# Patient Record
Sex: Male | Born: 1960 | Race: Black or African American | Hispanic: No | Marital: Single | Smoking: Never smoker
Health system: Southern US, Community
[De-identification: ages and names within clinical notes are randomized; demographics above are authoritative.]

## PROBLEM LIST (undated history)

## (undated) DIAGNOSIS — N186 End stage renal disease: Secondary | ICD-10-CM

## (undated) DIAGNOSIS — Z992 Dependence on renal dialysis: Secondary | ICD-10-CM

## (undated) DIAGNOSIS — I10 Essential (primary) hypertension: Secondary | ICD-10-CM

## (undated) HISTORY — DX: Essential (primary) hypertension: I10

## (undated) HISTORY — PX: AV FISTULA PLACEMENT: SHX1204

---

## 1998-05-11 ENCOUNTER — Emergency Department (HOSPITAL_COMMUNITY): Admission: EM | Admit: 1998-05-11 | Discharge: 1998-05-11 | Payer: Self-pay | Admitting: Emergency Medicine

## 1999-06-23 ENCOUNTER — Emergency Department (HOSPITAL_COMMUNITY): Admission: EM | Admit: 1999-06-23 | Discharge: 1999-06-23 | Payer: Self-pay | Admitting: Internal Medicine

## 2003-10-18 ENCOUNTER — Emergency Department (HOSPITAL_COMMUNITY): Admission: EM | Admit: 2003-10-18 | Discharge: 2003-10-18 | Payer: Self-pay | Admitting: Emergency Medicine

## 2004-02-19 ENCOUNTER — Emergency Department (HOSPITAL_COMMUNITY): Admission: EM | Admit: 2004-02-19 | Discharge: 2004-02-20 | Payer: Self-pay | Admitting: Emergency Medicine

## 2005-10-22 ENCOUNTER — Emergency Department (HOSPITAL_COMMUNITY): Admission: EM | Admit: 2005-10-22 | Discharge: 2005-10-22 | Payer: Self-pay | Admitting: Emergency Medicine

## 2008-09-07 HISTORY — PX: APPENDECTOMY: SHX54

## 2009-03-04 ENCOUNTER — Emergency Department (HOSPITAL_COMMUNITY): Admission: EM | Admit: 2009-03-04 | Discharge: 2009-03-04 | Payer: Self-pay | Admitting: Emergency Medicine

## 2009-05-13 ENCOUNTER — Observation Stay (HOSPITAL_COMMUNITY): Admission: EM | Admit: 2009-05-13 | Discharge: 2009-05-14 | Payer: Self-pay | Admitting: Emergency Medicine

## 2009-05-13 ENCOUNTER — Encounter (INDEPENDENT_AMBULATORY_CARE_PROVIDER_SITE_OTHER): Payer: Self-pay | Admitting: General Surgery

## 2010-12-12 LAB — URINALYSIS, ROUTINE W REFLEX MICROSCOPIC
Glucose, UA: NEGATIVE mg/dL
Specific Gravity, Urine: 1.01 (ref 1.005–1.030)
Urobilinogen, UA: 1 mg/dL (ref 0.0–1.0)

## 2010-12-12 LAB — DIFFERENTIAL
Basophils Absolute: 0.1 10*3/uL (ref 0.0–0.1)
Basophils Relative: 4 % — ABNORMAL HIGH (ref 0–1)
Eosinophils Absolute: 0.7 10*3/uL (ref 0.0–0.7)
Lymphocytes Relative: 26 % (ref 12–46)
Lymphocytes Relative: 28 % (ref 12–46)
Lymphs Abs: 1.2 10*3/uL (ref 0.7–4.0)
Monocytes Absolute: 0.6 10*3/uL (ref 0.1–1.0)
Monocytes Relative: 10 % (ref 3–12)
Neutro Abs: 2.2 10*3/uL (ref 1.7–7.7)
Neutrophils Relative %: 48 % (ref 43–77)
Neutrophils Relative %: 51 % (ref 43–77)

## 2010-12-12 LAB — COMPREHENSIVE METABOLIC PANEL
ALT: 16 U/L (ref 0–53)
Alkaline Phosphatase: 49 U/L (ref 39–117)
BUN: 12 mg/dL (ref 6–23)
CO2: 30 mEq/L (ref 19–32)
Calcium: 9.1 mg/dL (ref 8.4–10.5)
GFR calc Af Amer: 49 mL/min — ABNORMAL LOW (ref >60)
GFR calc non Af Amer: 41 mL/min — ABNORMAL LOW (ref >60)
Glucose, Bld: 110 mg/dL — ABNORMAL HIGH (ref 70–99)
Potassium: 3.4 mEq/L — ABNORMAL LOW (ref 3.5–5.1)
Sodium: 138 mEq/L (ref 135–145)

## 2010-12-12 LAB — URINE MICROSCOPIC-ADD ON

## 2010-12-12 LAB — LIPASE, BLOOD: Lipase: 24 U/L (ref 11–59)

## 2010-12-12 LAB — CBC
MCHC: 33.8 g/dL (ref 30.0–36.0)
MCV: 80.9 fL (ref 78.0–100.0)
Platelets: 249 10*3/uL (ref 150–400)
RBC: 5.06 MIL/uL (ref 4.22–5.81)
RDW: 14.7 % (ref 11.5–15.5)
WBC: 4.6 10*3/uL (ref 4.0–10.5)
WBC: 6.6 10*3/uL (ref 4.0–10.5)

## 2010-12-12 LAB — BASIC METABOLIC PANEL
Calcium: 8.7 mg/dL (ref 8.4–10.5)
Creatinine, Ser: 1.72 mg/dL — ABNORMAL HIGH (ref 0.4–1.5)
GFR calc Af Amer: 52 mL/min — ABNORMAL LOW (ref >60)
GFR calc non Af Amer: 43 mL/min — ABNORMAL LOW (ref >60)
Sodium: 136 mEq/L (ref 135–145)

## 2010-12-15 LAB — URINALYSIS, ROUTINE W REFLEX MICROSCOPIC
Nitrite: NEGATIVE
Specific Gravity, Urine: 1.013 (ref 1.005–1.030)
pH: 5.5 (ref 5.0–8.0)

## 2010-12-15 LAB — POCT I-STAT, CHEM 8
BUN: 20 mg/dL (ref 6–23)
Calcium, Ion: 1.15 mmol/L (ref 1.12–1.32)
Chloride: 103 mEq/L (ref 96–112)
Glucose, Bld: 101 mg/dL — ABNORMAL HIGH (ref 70–99)
HCT: 41 % (ref 39.0–52.0)

## 2010-12-15 LAB — CBC
HCT: 38.5 % — ABNORMAL LOW (ref 39.0–52.0)
Hemoglobin: 13 g/dL (ref 13.0–17.0)
MCHC: 33.8 g/dL (ref 30.0–36.0)
RBC: 4.73 MIL/uL (ref 4.22–5.81)

## 2010-12-15 LAB — DIFFERENTIAL
Basophils Relative: 1 % (ref 0–1)
Monocytes Absolute: 0.4 10*3/uL (ref 0.1–1.0)
Monocytes Relative: 9 % (ref 3–12)
Neutro Abs: 1.9 10*3/uL (ref 1.7–7.7)

## 2010-12-15 LAB — POCT CARDIAC MARKERS: Troponin i, poc: <0.05 ng/mL (ref 0.00–0.09)

## 2014-03-05 ENCOUNTER — Telehealth: Payer: Self-pay | Admitting: General Practice

## 2014-03-05 NOTE — Telephone Encounter (Signed)
Patient called to schedule his colonoscopy  Routing to [facility].

## 2014-03-06 NOTE — Telephone Encounter (Signed)
Routing to Julie.  

## 2014-03-06 NOTE — Telephone Encounter (Signed)
Noted. This is in DS box.

## 2014-03-08 NOTE — Telephone Encounter (Signed)
I called pt and he is at work and will call me back on Mon. He will need office visit first since he has been having blood pressure problems.   Please schedule OV and get paperwork from me if he calls on Mon and I am busy.   [facility] will [facility].

## 2014-03-12 NOTE — Telephone Encounter (Signed)
Agree 

## 2014-03-12 NOTE — Telephone Encounter (Signed)
Pt came by the office. He said he is still having problems with his blood pressure being elevated.  He has not told the [place] yet. I told him to see them and check it out. That if he doesn't have his BP under control we will not schedule the colonoscopy when he comes for the ov. He said that he would call them and check it out.   He is scheduled an OV with Neil Crouch, PA on 04/23/2014 at 10:30 AM.

## 2014-04-23 ENCOUNTER — Ambulatory Visit: Payer: Self-pay | Admitting: Gastroenterology

## 2014-05-24 ENCOUNTER — Encounter: Payer: Self-pay | Admitting: Gastroenterology

## 2014-05-24 ENCOUNTER — Ambulatory Visit: Payer: Self-pay | Admitting: Gastroenterology

## 2014-06-05 ENCOUNTER — Encounter: Payer: Self-pay | Admitting: Internal Medicine

## 2014-06-18 ENCOUNTER — Encounter: Payer: Self-pay | Admitting: Gastroenterology

## 2014-07-03 ENCOUNTER — Ambulatory Visit: Payer: Self-pay | Admitting: Gastroenterology

## 2014-07-09 ENCOUNTER — Ambulatory Visit: Payer: Self-pay | Admitting: Gastroenterology

## 2014-07-17 ENCOUNTER — Ambulatory Visit (INDEPENDENT_AMBULATORY_CARE_PROVIDER_SITE_OTHER): Payer: BC Managed Care – PPO | Admitting: Gastroenterology

## 2014-07-17 ENCOUNTER — Telehealth: Payer: Self-pay

## 2014-07-17 ENCOUNTER — Encounter: Payer: Self-pay | Admitting: Gastroenterology

## 2014-07-17 VITALS — BP 148/95 | HR 61 | Temp 97.1°F | Ht 71.0 in | Wt 166.0 lb

## 2014-07-17 DIAGNOSIS — Z1211 Encounter for screening for malignant neoplasm of colon: Secondary | ICD-10-CM | POA: Insufficient documentation

## 2014-07-17 DIAGNOSIS — I10 Essential (primary) hypertension: Secondary | ICD-10-CM

## 2014-07-17 NOTE — Assessment & Plan Note (Signed)
Colonoscopy in near future.  I have discussed the risks, alternatives, benefits with regards to but not limited to the risk of reaction to medication, bleeding, infection, perforation and the patient is agreeable to proceed. Written consent to be obtained.

## 2014-07-17 NOTE — Telephone Encounter (Signed)
I faxed a request for updated referral to the [place] at [facility] on this pt. I have him tentatively scheduled for TCS with Dr. Oneida Alar on 08/20/2014.

## 2014-07-17 NOTE — Patient Instructions (Signed)
1. Colonoscopy as scheduled. See separate instructions.  

## 2014-07-17 NOTE — Progress Notes (Signed)
Primary Care Physician:  J[facility]Peggy Anda Latina, NP  Primary Gastroenterologist:  Barney Drain, MD   Chief Complaint  Patient presents with  . Colonoscopy    HPI:  Justin Barrett is a 53 y.o. male here to schedule first ever screening colonoscopy. Referred from [facility]. Patient denies issues with constipation, diarrhea, melena, brbpr, abd pain, GERD, dysphagia, weight loss. Not aware of any FH of colon cancer or polyps. Has had some issues with his BP not being well-controlled which had previously delayed his colonoscopy but this has since been addressed.  Current Outpatient Prescriptions  Medication Sig Dispense Refill  . amLODipine (NORVASC) 10 MG tablet Take 10 mg by mouth daily.    . cloNIDine (CATAPRES) 0.1 MG tablet Take 0.1 mg by mouth 2 (two) times daily.     No current facility-administered medications for this visit.    Allergies as of 07/17/2014  . (No Known Allergies)    Past Medical History  Diagnosis Date  . HTN (hypertension)     Past Surgical History  Procedure Laterality Date  . Appendectomy  2010    Family History  Problem Relation Age of Onset  . Colon cancer Neg Hx     not known    History   Social History  . Marital Status: Single    Spouse Name: N/A    Number of Children: 0  . Years of Education: N/A   Occupational History  . [employer]    Social History Main Topics  . Smoking status: Never Smoker   . Smokeless tobacco: Not on file  . Alcohol Use: No  . Drug Use: No  . Sexual Activity: Not on file   Other Topics Concern  . Not on file   Social History Narrative  . No narrative on file      ROS:  General: Negative for anorexia, weight loss, fever, chills, fatigue, weakness. Eyes: Negative for vision changes.  ENT: Negative for hoarseness, difficulty swallowing , nasal congestion. CV: Negative for chest pain, angina, palpitations, dyspnea on exertion, peripheral edema.  Respiratory: Negative for dyspnea at  rest, dyspnea on exertion, cough, sputum, wheezing.  GI: See history of present illness. GU:  Negative for dysuria, hematuria, urinary incontinence, urinary frequency, nocturnal urination.  MS: Negative for joint pain, low back pain.  Derm: Negative for rash or itching.  Neuro: Negative for weakness, abnormal sensation, seizure, frequent headaches, memory loss, confusion.  Psych: Negative for anxiety, depression, suicidal ideation, hallucinations.  Endo: Negative for unusual weight change.  Heme: Negative for bruising or bleeding. Allergy: Negative for rash or hives.    Physical Examination:  BP 148/95 mmHg  Pulse 61  Temp(Src) 97.1 F (36.2 C) (Oral)  Ht 5\' 11"  (1.803 m)  Wt 166 lb (75.297 kg)  BMI 23.16 kg/m2   General: Well-nourished, well-developed in no acute distress.  Head: Normocephalic, atraumatic.   Eyes: Conjunctiva pink, no icterus. Mouth: Oropharyngeal mucosa moist and pink , no lesions erythema or exudate. Neck: Supple without thyromegaly, masses, or lymphadenopathy.  Lungs: Clear to auscultation bilaterally.  Heart: Regular rate and rhythm, no murmurs rubs or gallops.  Abdomen: Bowel sounds are normal, nontender, nondistended, no hepatosplenomegaly or masses, no abdominal bruits or    hernia , no rebound or guarding.   Rectal: not performed Extremities: No lower extremity edema. No clubbing or deformities.  Neuro: Alert and oriented x 4 , grossly normal neurologically.  Skin: Warm and dry, no rash or jaundice.   Psych: Alert  and cooperative, normal mood and affect.

## 2014-07-19 NOTE — Telephone Encounter (Signed)
Noted  

## 2014-07-19 NOTE — Telephone Encounter (Signed)
I received info from the [place] that the pt's authorization for  His colonoscopy expired on 03/27/2014. As directed by the chief of staff of the [facility], they will not extend the his authorization. They said to contact pt and yhave him contact his PCP at the [facility] to be scheduled for TCS. I called pt and told him, but he asked if his Secondary, [employer] would cover. I told him to give them a call and find out and let me know.  He said he will do so.

## 2014-07-24 NOTE — Telephone Encounter (Signed)
Noted  

## 2014-07-24 NOTE — Telephone Encounter (Signed)
I spoke to pt this morning and he said the [place] called him and he has an appt there on 07/27/2014 . They are planning to get him scheduled there for the colonoscopy.

## 2014-09-20 ENCOUNTER — Emergency Department (HOSPITAL_COMMUNITY)
Admission: EM | Admit: 2014-09-20 | Discharge: 2014-09-20 | Disposition: A | Discharge status: Home / Self Care | Payer: BLUE CROSS/BLUE SHIELD | Attending: Emergency Medicine | Admitting: Emergency Medicine

## 2014-09-20 ENCOUNTER — Encounter (HOSPITAL_COMMUNITY): Payer: Self-pay | Admitting: Emergency Medicine

## 2014-09-20 DIAGNOSIS — R109 Unspecified abdominal pain: Secondary | ICD-10-CM | POA: Diagnosis not present

## 2014-09-20 DIAGNOSIS — I10 Essential (primary) hypertension: Secondary | ICD-10-CM | POA: Diagnosis not present

## 2014-09-20 DIAGNOSIS — R110 Nausea: Secondary | ICD-10-CM | POA: Diagnosis not present

## 2014-09-20 DIAGNOSIS — Z79899 Other long term (current) drug therapy: Secondary | ICD-10-CM | POA: Insufficient documentation

## 2014-09-20 DIAGNOSIS — R519 Headache, unspecified: Secondary | ICD-10-CM

## 2014-09-20 DIAGNOSIS — R51 Headache: Secondary | ICD-10-CM | POA: Insufficient documentation

## 2014-09-20 LAB — COMPREHENSIVE METABOLIC PANEL WITH GFR
ALT: 11 U/L (ref 0–53)
AST: 12 U/L (ref 0–37)
Albumin: 3.6 g/dL (ref 3.5–5.2)
Alkaline Phosphatase: 76 U/L (ref 39–117)
Anion gap: 7 (ref 5–15)
BUN: 23 mg/dL (ref 6–23)
CO2: 26 mmol/L (ref 19–32)
Calcium: 9.1 mg/dL (ref 8.4–10.5)
Chloride: 105 meq/L (ref 96–112)
Creatinine, Ser: 4.15 mg/dL — ABNORMAL HIGH (ref 0.50–1.35)
GFR calc Af Amer: 18 mL/min — ABNORMAL LOW
GFR calc non Af Amer: 15 mL/min — ABNORMAL LOW
Glucose, Bld: 104 mg/dL — ABNORMAL HIGH (ref 70–99)
Potassium: 4.4 mmol/L (ref 3.5–5.1)
Sodium: 138 mmol/L (ref 135–145)
Total Bilirubin: 0.9 mg/dL (ref 0.3–1.2)
Total Protein: 7 g/dL (ref 6.0–8.3)

## 2014-09-20 LAB — CBC WITH DIFFERENTIAL/PLATELET
Basophils Absolute: 0 K/uL (ref 0.0–0.1)
Basophils Relative: 0 % (ref 0–1)
Eosinophils Absolute: 0.1 K/uL (ref 0.0–0.7)
Eosinophils Relative: 2 % (ref 0–5)
HCT: 39.4 % (ref 39.0–52.0)
Hemoglobin: 13.2 g/dL (ref 13.0–17.0)
Lymphocytes Relative: 22 % (ref 12–46)
Lymphs Abs: 1.6 K/uL (ref 0.7–4.0)
MCH: 26.3 pg (ref 26.0–34.0)
MCHC: 33.5 g/dL (ref 30.0–36.0)
MCV: 78.6 fL (ref 78.0–100.0)
Monocytes Absolute: 0.6 K/uL (ref 0.1–1.0)
Monocytes Relative: 9 % (ref 3–12)
Neutro Abs: 4.8 K/uL (ref 1.7–7.7)
Neutrophils Relative %: 67 % (ref 43–77)
Platelets: 309 K/uL (ref 150–400)
RBC: 5.01 MIL/uL (ref 4.22–5.81)
RDW: 14.1 % (ref 11.5–15.5)
WBC: 7.2 K/uL (ref 4.0–10.5)

## 2014-09-20 MED ORDER — CLONIDINE HCL 0.1 MG PO TABS
0.1000 mg | ORAL_TABLET | Freq: Once | ORAL | Status: AC
  Administered 2014-09-20: 0.1 mg via ORAL
  Filled 2014-09-20: qty 1

## 2014-09-20 MED ORDER — SODIUM CHLORIDE 0.9 % IV BOLUS (SEPSIS)
1000.0000 mL | Freq: Once | INTRAVENOUS | Status: AC
  Administered 2014-09-20: 1000 mL via INTRAVENOUS

## 2014-09-20 MED ORDER — IBUPROFEN 800 MG PO TABS: 800.0000 mg | ORAL_TABLET | Freq: Three times a day (TID) | ORAL | Status: DC

## 2014-09-20 MED ORDER — ONDANSETRON 4 MG PO TBDP: ORAL_TABLET | ORAL | Status: DC

## 2014-09-20 MED ORDER — KETOROLAC TROMETHAMINE 30 MG/ML IJ SOLN
30.0000 mg | Freq: Once | INTRAMUSCULAR | Status: AC
  Administered 2014-09-20: 30 mg via INTRAVENOUS
  Filled 2014-09-20: qty 1

## 2014-09-20 NOTE — ED Notes (Signed)
Having headache and  stomach ache for last two days.  Rate pain for both 8.  Have not taken any medications this morning including BP med.  Noted to have swelling to left lower leg and ankle.  Pt says this is a side effect of BP medication and does not bother him.

## 2014-09-20 NOTE — Discharge Instructions (Signed)
Drink plenty of fluids and follow up with your md if not improving.

## 2014-09-20 NOTE — ED Provider Notes (Signed)
CSN: [id number]     Arrival date & time 09/20/14  0705 History  This chart was scribed for Justin Diego, MD by Stephania Fragmin, ED Scribe. This patient was seen in room APA18/APA18 and the patient's care was started at 7:51 AM.    Chief Complaint  Patient presents with  . Headache   Patient is a 54 y.o. male presenting with headaches. The history is provided by the patient. No language interpreter was used.  Headache Pain location:  Generalized Radiates to:  Does not radiate Severity currently:  8/10 Duration:  2 days Timing:  Constant Chronicity:  New Relieved by:  None tried Worsened by:  Nothing tried Ineffective treatments:  None tried Associated symptoms: abdominal pain and nausea   Associated symptoms: no back pain, no congestion, no cough, no diarrhea, no fatigue, no fever, no seizures, no sinus pressure, no sore throat and no vomiting     HPI Comments: MANAS MCGUGAN is a 54 y.o. male who presents to the Emergency Department complaining of 8/10 headache and periumbilical abdominal pain that began 2 days ago. He also complains of associated nausea and chills. Patient states he takes clonidine and amlodipine daily, but he hasn't taken any this morning. He denies any sick contact. He also denies vomiting, fever, cough, sore throat, and diarrhea.  Past Medical History  Diagnosis Date  . HTN (hypertension)    Past Surgical History  Procedure Laterality Date  . Appendectomy  2010   Family History  Problem Relation Age of Onset  . Colon cancer Neg Hx     not known   History  Substance Use Topics  . Smoking status: Never Smoker   . Smokeless tobacco: Not on file  . Alcohol Use: No    Review of Systems  Constitutional: Positive for chills. Negative for fever, appetite change and fatigue.  HENT: Negative for congestion, ear discharge, sinus pressure and sore throat.   Eyes: Negative for discharge.  Respiratory: Negative for cough.   Cardiovascular: Negative for chest  pain.  Gastrointestinal: Positive for nausea and abdominal pain. Negative for vomiting and diarrhea.  Genitourinary: Negative for frequency and hematuria.  Musculoskeletal: Negative for back pain.  Skin: Negative for rash.  Neurological: Positive for headaches. Negative for seizures.  Psychiatric/Behavioral: Negative for hallucinations.      Allergies  Review of patient's allergies indicates no known allergies.  Home Medications   Prior to Admission medications   Medication Sig Start Date End Date Taking? Authorizing Provider  amLODipine (NORVASC) 10 MG tablet Take 10 mg by mouth daily.    Historical Provider, MD  cloNIDine (CATAPRES) 0.1 MG tablet Take 0.1 mg by mouth 2 (two) times daily.    Historical Provider, MD   BP 174/113 mmHg  Pulse 70  Temp(Src) 98.5 F (36.9 C) (Oral)  Resp 18  Ht 5\' 11"  (1.803 m)  Wt 160 lb (72.576 kg)  BMI 22.33 kg/m2  SpO2 99% Physical Exam  Constitutional: He is oriented to person, place, and time. He appears well-developed.  HENT:  Head: Normocephalic.  Dry mucus membranes.  Eyes: Conjunctivae and EOM are normal. No scleral icterus.  Neck: Neck supple. No thyromegaly present.  Cardiovascular: Normal rate and regular rhythm.  Exam reveals no gallop and no friction rub.   No murmur heard. Pulmonary/Chest: No stridor. He has no wheezes. He has no rales. He exhibits no tenderness.  Abdominal: He exhibits no distension. There is no tenderness. There is no rebound.  Musculoskeletal: Normal range of  motion. He exhibits no edema.  Lymphadenopathy:    He has no cervical adenopathy.  Neurological: He is oriented to person, place, and time. He exhibits normal muscle tone. Coordination normal.  Skin: No rash noted. No erythema.  Psychiatric: He has a normal mood and affect. His behavior is normal.  Nursing note and vitals reviewed.   ED Course  Procedures (including critical care time)  DIAGNOSTIC STUDIES: Oxygen Saturation is 100% on room  air, normal by my interpretation.    COORDINATION OF CARE: 7:53 AM - Discussed treatment plan with pt at bedside which includes IV fluids, pain medication (Toradol), clonidine, and blood tests, and pt agreed to plan.   Labs Review Labs Reviewed - No data to display  Imaging Review No results found.   EKG Interpretation None      MDM   Final diagnoses:  None     Headache,  Viral syndrome,   tx with motrin and zofran    Justin Diego, MD 09/20/14 [phone]

## 2014-09-20 NOTE — ED Notes (Addendum)
Pt reports headache and abdominal pain x2 days. Pt denies any n/v/d/fevers,dizziness,vision changes. nad noted.

## 2015-01-11 ENCOUNTER — Encounter (HOSPITAL_COMMUNITY): Payer: Self-pay | Admitting: *Deleted

## 2015-01-11 ENCOUNTER — Emergency Department (HOSPITAL_COMMUNITY): Payer: BLUE CROSS/BLUE SHIELD

## 2015-01-11 ENCOUNTER — Emergency Department (HOSPITAL_COMMUNITY)
Admission: EM | Admit: 2015-01-11 | Discharge: 2015-01-11 | Disposition: A | Discharge status: Home / Self Care | Payer: BLUE CROSS/BLUE SHIELD | Attending: Emergency Medicine | Admitting: Emergency Medicine

## 2015-01-11 DIAGNOSIS — Y998 Other external cause status: Secondary | ICD-10-CM | POA: Insufficient documentation

## 2015-01-11 DIAGNOSIS — S161XXA Strain of muscle, fascia and tendon at neck level, initial encounter: Secondary | ICD-10-CM | POA: Insufficient documentation

## 2015-01-11 DIAGNOSIS — Y92410 Unspecified street and highway as the place of occurrence of the external cause: Secondary | ICD-10-CM | POA: Insufficient documentation

## 2015-01-11 DIAGNOSIS — Y9389 Activity, other specified: Secondary | ICD-10-CM | POA: Diagnosis not present

## 2015-01-11 DIAGNOSIS — I10 Essential (primary) hypertension: Secondary | ICD-10-CM | POA: Insufficient documentation

## 2015-01-11 DIAGNOSIS — S29001A Unspecified injury of muscle and tendon of front wall of thorax, initial encounter: Secondary | ICD-10-CM | POA: Insufficient documentation

## 2015-01-11 DIAGNOSIS — R0789 Other chest pain: Secondary | ICD-10-CM

## 2015-01-11 DIAGNOSIS — S199XXA Unspecified injury of neck, initial encounter: Secondary | ICD-10-CM | POA: Diagnosis present

## 2015-01-11 DIAGNOSIS — Z79899 Other long term (current) drug therapy: Secondary | ICD-10-CM | POA: Insufficient documentation

## 2015-01-11 MED ORDER — NAPROXEN 500 MG PO TABS: 500.0000 mg | ORAL_TABLET | Freq: Two times a day (BID) | ORAL | Status: DC
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
## 2016-01-12 LAB — BASIC METABOLIC PANEL: Glucose: 72 mg/dL

## 2017-04-05 ENCOUNTER — Emergency Department (HOSPITAL_COMMUNITY)
Admission: EM | Admit: 2017-04-05 | Discharge: 2017-04-06 | Disposition: A | Discharge status: Home / Self Care | Payer: BLUE CROSS/BLUE SHIELD | Attending: Emergency Medicine | Admitting: Emergency Medicine

## 2017-04-05 ENCOUNTER — Emergency Department (HOSPITAL_COMMUNITY): Payer: BLUE CROSS/BLUE SHIELD

## 2017-04-05 ENCOUNTER — Encounter (HOSPITAL_COMMUNITY): Payer: Self-pay | Admitting: Emergency Medicine

## 2017-04-05 DIAGNOSIS — M25511 Pain in right shoulder: Secondary | ICD-10-CM | POA: Diagnosis not present

## 2017-04-05 DIAGNOSIS — M546 Pain in thoracic spine: Secondary | ICD-10-CM | POA: Insufficient documentation

## 2017-04-05 DIAGNOSIS — M549 Dorsalgia, unspecified: Secondary | ICD-10-CM | POA: Diagnosis present

## 2017-04-05 DIAGNOSIS — Z452 Encounter for adjustment and management of vascular access device: Secondary | ICD-10-CM | POA: Diagnosis not present

## 2017-04-05 DIAGNOSIS — I150 Renovascular hypertension: Secondary | ICD-10-CM

## 2017-04-05 DIAGNOSIS — I120 Hypertensive chronic kidney disease with stage 5 chronic kidney disease or end stage renal disease: Secondary | ICD-10-CM | POA: Insufficient documentation

## 2017-04-05 DIAGNOSIS — N186 End stage renal disease: Secondary | ICD-10-CM | POA: Diagnosis not present

## 2017-04-05 DIAGNOSIS — Z992 Dependence on renal dialysis: Secondary | ICD-10-CM | POA: Diagnosis not present

## 2017-04-05 DIAGNOSIS — Z79899 Other long term (current) drug therapy: Secondary | ICD-10-CM | POA: Diagnosis not present

## 2017-04-05 HISTORY — DX: Dependence on renal dialysis: N18.6

## 2017-04-05 HISTORY — DX: Dependence on renal dialysis: Z99.2

## 2017-04-05 LAB — I-STAT TROPONIN, ED: Troponin i, poc: 0.01 ng/mL (ref 0.00–0.08)

## 2017-04-05 LAB — I-STAT CHEM 8, ED
BUN: 27 mg/dL — ABNORMAL HIGH (ref 6–20)
CHLORIDE: 99 mmol/L — AB (ref 101–111)
Calcium, Ion: 1.13 mmol/L — ABNORMAL LOW (ref 1.15–1.40)
Creatinine, Ser: 5.4 mg/dL — ABNORMAL HIGH (ref 0.61–1.24)
Glucose, Bld: 97 mg/dL (ref 65–99)
HCT: 38 % — ABNORMAL LOW (ref 39.0–52.0)
Hemoglobin: 12.9 g/dL — ABNORMAL LOW (ref 13.0–17.0)
Potassium: 3.5 mmol/L (ref 3.5–5.1)
SODIUM: 138 mmol/L (ref 135–145)
TCO2: 27 mmol/L (ref 0–100)

## 2017-04-05 MED ORDER — CLONIDINE HCL 0.2 MG PO TABS
0.2000 mg | ORAL_TABLET | Freq: Once | ORAL | Status: AC
  Administered 2017-04-05: 0.2 mg via ORAL
  Filled 2017-04-05: qty 1

## 2017-04-05 MED ORDER — IOPAMIDOL (ISOVUE-370) INJECTION 76%
100.0000 mL | Freq: Once | INTRAVENOUS | Status: AC | PRN
  Administered 2017-04-05: 100 mL via INTRAVENOUS

## 2017-04-05 MED ORDER — HYDROMORPHONE HCL 1 MG/ML IJ SOLN
1.0000 mg | Freq: Once | INTRAMUSCULAR | Status: AC
  Administered 2017-04-05: 1 mg via INTRAVENOUS
  Filled 2017-04-05: qty 1

## 2017-04-05 MED ORDER — ONDANSETRON HCL 4 MG/2ML IJ SOLN
4.0000 mg | Freq: Once | INTRAMUSCULAR | Status: AC
  Administered 2017-04-05: 4 mg via INTRAVENOUS
  Filled 2017-04-05: qty 2

## 2017-04-05 NOTE — ED Notes (Signed)
Pt brought back form Ct

## 2017-04-05 NOTE — ED Provider Notes (Signed)
[facility] DEPT Provider Note   CSN: [id number] Arrival date & time: 04/05/17  1632     History   Chief Complaint Chief Complaint  Patient presents with  . Back Pain    HPI Justin Barrett is a 56 y.o. male.  Patient complains of pain in between his scapulas in the upper back for couple days. Does not seem to be worse with lifting   The history is provided by the patient.  Back Pain   This is a new problem. The current episode started 2 days ago. The problem occurs constantly. The problem has not changed since onset.The pain is associated with no known injury. The pain is present in the thoracic spine. The quality of the pain is described as stabbing. The pain does not radiate. The pain is at a severity of 4/10. The pain is moderate. Exacerbated by: Nothing. Pertinent negatives include no chest pain, no headaches and no abdominal pain.    Past Medical History:  Diagnosis Date  . End stage renal failure on dialysis ([place])   . HTN (hypertension)     Patient Active Problem List   Diagnosis Date Noted  . HTN (hypertension) 07/17/2014  . Encounter for screening colonoscopy 07/17/2014    Past Surgical History:  Procedure Laterality Date  . APPENDECTOMY  2010  . AV FISTULA PLACEMENT         Home Medications    Prior to Admission medications   Medication Sig Start Date End Date Taking? Authorizing Provider  amLODipine (NORVASC) 10 MG tablet Take 10 mg by mouth daily.    [provider]  cloNIDine (CATAPRES) 0.2 MG tablet Take 1 tablet by mouth daily.  01/04/15   [provider]  ibuprofen (ADVIL,MOTRIN) 800 MG tablet Take 1 tablet (800 mg total) by mouth 3 (three) times daily. Patient not taking: Reported on 01/11/2015 09/20/14   Milton Ferguson, MD  naproxen (NAPROSYN) 500 MG tablet Take 1 tablet (500 mg total) by mouth 2 (two) times daily. 01/11/15   Fredia Sorrow, MD  ondansetron (ZOFRAN ODT) 4 MG disintegrating tablet 4mg  ODT q4 hours prn  nausea/vomit Patient not taking: Reported on 01/11/2015 09/20/14   Milton Ferguson, MD    Family History Family History  Problem Relation Age of Onset  . Colon cancer Neg Hx        not known    Social History Social History  Substance Use Topics  . Smoking status: Never Smoker  . Smokeless tobacco: Never Used  . Alcohol use No     Allergies   Patient has no known allergies.   Review of Systems Review of Systems  Constitutional: Negative for appetite change and fatigue.  HENT: Negative for congestion, ear discharge and sinus pressure.   Eyes: Negative for discharge.  Respiratory: Negative for cough.   Cardiovascular: Negative for chest pain.  Gastrointestinal: Negative for abdominal pain and diarrhea.  Genitourinary: Negative for frequency and hematuria.  Musculoskeletal: Positive for back pain.  Skin: Negative for rash.  Neurological: Negative for seizures and headaches.  Psychiatric/Behavioral: Negative for hallucinations.     Physical Exam Updated Vital Signs BP (!) 148/104   Pulse (!) 49   Temp 97.8 F (36.6 C) (Oral)   Resp 16   Ht 5\' 11"  (1.803 m)   Wt 77.1 kg (170 lb)   SpO2 97%   BMI 23.71 kg/m   Physical Exam  Constitutional: He is oriented to person, place, and time. He appears well-developed.  HENT:  Head:  Normocephalic.  Eyes: Conjunctivae and EOM are normal. No scleral icterus.  Neck: Neck supple. No thyromegaly present.  Cardiovascular: Normal rate and regular rhythm.  Exam reveals no gallop and no friction rub.   No murmur heard. Pulmonary/Chest: No stridor. He has no wheezes. He has no rales. He exhibits no tenderness.  Abdominal: He exhibits no distension. There is no tenderness. There is no rebound.  Musculoskeletal: Normal range of motion. He exhibits no edema.  Lymphadenopathy:    He has no cervical adenopathy.  Neurological: He is oriented to person, place, and time. He exhibits normal muscle tone. Coordination normal.  Skin: No rash  noted. No erythema.  Psychiatric: He has a normal mood and affect. His behavior is normal.     ED Treatments / Results  Labs (all labs ordered are listed, but only abnormal results are displayed) Labs Reviewed  I-STAT CHEM 8, ED - Abnormal; Notable for the following:       Result Value   Chloride 99 (*)    BUN 27 (*)    Creatinine, Ser 5.40 (*)    Calcium, Ion 1.13 (*)    Hemoglobin 12.9 (*)    HCT 38.0 (*)    All other components within normal limits  I-STAT TROPONIN, ED    EKG  EKG Interpretation  Date/Time:  Monday April 05 2017 22:23:14 EDT Ventricular Rate:  53 PR Interval:    QRS Duration: 89 QT Interval:  452 QTC Calculation: 425 R Axis:   21 Text Interpretation:  Sinus rhythm Probable left atrial enlargement Abnormal R-wave progression, early transition ST elevation, consider inferior injury Confirmed by Milton Ferguson [id number]) on 04/05/2017 11:00:14 PM       Radiology Dg Chest 2 View  Result Date: 04/05/2017 CLINICAL DATA:  56 year old male with chest pain. EXAM: CHEST  2 VIEW COMPARISON:  Chest radiograph dated 01/11/2015 FINDINGS: Right IJ dialysis catheter with tip over the right atrium. The lungs are clear. There is no pleural effusion or pneumothorax. The cardiac silhouette is within normal limits. No acute osseous pathology. IMPRESSION: 1. No acute cardiopulmonary process. 2. Dialysis catheter with tip over the right atrium. Electronically Signed   By: Anner Crete M.D.   On: 04/05/2017 23:47    Procedures Procedures (including critical care time)  Medications Ordered in ED Medications  HYDROmorphone (DILAUDID) injection 1 mg (1 mg Intravenous Given 04/05/17 2304)  ondansetron (ZOFRAN) injection 4 mg (4 mg Intravenous Given 04/05/17 2303)  cloNIDine (CATAPRES) tablet 0.2 mg (0.2 mg Oral Given 04/05/17 2225)  iopamidol (ISOVUE-370) 76 % injection 100 mL (100 mLs Intravenous Contrast Given 04/05/17 2342)     Initial Impression / Assessment and Plan / ED  Course  I have reviewed the triage vital signs and the nursing notes.  Pertinent labs & imaging results that were available during my care of the patient were reviewed by me and considered in my medical decision making (see chart for details).     Thoracic back pain.    Final Clinical Impressions(s) / ED Diagnoses   Final diagnoses:  None    New Prescriptions New Prescriptions   No medications on file     Milton Ferguson, MD 04/10/17 1446

## 2017-04-05 NOTE — ED Triage Notes (Signed)
Patient complaining of upper back pain at right shoulder blade x 3 days. 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

## 2017-04-06 MED ORDER — HYDROCODONE-ACETAMINOPHEN 5-325 MG PO TABS: 1.0000 | ORAL_TABLET | Freq: Four times a day (QID) | ORAL | 0 refills | Status: DC | PRN

## 2017-04-06 NOTE — ED Provider Notes (Signed)
Patient signed out pending CTA of the chest. CT negative for dissection. There was some concern about patient's Vas-Cath location. This appears to be in the low SVC. Blood pressure improved after clonidine. Patient given a short course of Norco given his history of renal dysfunction for his back pain. Discussed results with the patient.  After history, exam, and medical workup I feel the patient has been appropriately medically screened and is safe for discharge home. Pertinent diagnoses were discussed with the patient. Patient was given return precautions.    Merryl Hacker, MD 04/06/17 [phone]

## 2017-08-29 ENCOUNTER — Emergency Department (HOSPITAL_COMMUNITY)
Admission: EM | Admit: 2017-08-29 | Discharge: 2017-08-29 | Disposition: A | Discharge status: Home / Self Care | Payer: BLUE CROSS/BLUE SHIELD | Attending: Emergency Medicine | Admitting: Emergency Medicine

## 2017-08-29 ENCOUNTER — Encounter (HOSPITAL_COMMUNITY): Payer: Self-pay

## 2017-08-29 ENCOUNTER — Emergency Department (HOSPITAL_COMMUNITY): Payer: BLUE CROSS/BLUE SHIELD

## 2017-08-29 DIAGNOSIS — N186 End stage renal disease: Secondary | ICD-10-CM | POA: Diagnosis not present

## 2017-08-29 DIAGNOSIS — I10 Essential (primary) hypertension: Secondary | ICD-10-CM | POA: Insufficient documentation

## 2017-08-29 DIAGNOSIS — Z452 Encounter for adjustment and management of vascular access device: Secondary | ICD-10-CM

## 2017-08-29 DIAGNOSIS — R509 Fever, unspecified: Secondary | ICD-10-CM | POA: Diagnosis present

## 2017-08-29 DIAGNOSIS — Z79899 Other long term (current) drug therapy: Secondary | ICD-10-CM | POA: Diagnosis not present

## 2017-08-29 LAB — CBC WITH DIFFERENTIAL/PLATELET
Basophils Absolute: 0 10*3/uL (ref 0.0–0.1)
Basophils Relative: 0 %
EOS ABS: 0.1 10*3/uL (ref 0.0–0.7)
EOS PCT: 1 %
HCT: 33.6 % — ABNORMAL LOW (ref 39.0–52.0)
HEMOGLOBIN: 11.1 g/dL — AB (ref 13.0–17.0)
LYMPHS PCT: 6 %
Lymphs Abs: 0.5 10*3/uL — ABNORMAL LOW (ref 0.7–4.0)
MCH: 27.1 pg (ref 26.0–34.0)
MCHC: 33 g/dL (ref 30.0–36.0)
MCV: 82.2 fL (ref 78.0–100.0)
MONOS PCT: 1 %
Monocytes Absolute: 0.1 10*3/uL (ref 0.1–1.0)
Neutro Abs: 7.9 10*3/uL — ABNORMAL HIGH (ref 1.7–7.7)
Neutrophils Relative %: 92 %
PLATELETS: 220 10*3/uL (ref 150–400)
RBC: 4.09 MIL/uL — ABNORMAL LOW (ref 4.22–5.81)
RDW: 13 % (ref 11.5–15.5)
WBC: 8.6 10*3/uL (ref 4.0–10.5)

## 2017-08-29 LAB — COMPREHENSIVE METABOLIC PANEL
ALBUMIN: 3.2 g/dL — AB (ref 3.5–5.0)
ALT: 11 U/L — AB (ref 17–63)
AST: 15 U/L (ref 15–41)
Alkaline Phosphatase: 73 U/L (ref 38–126)
Anion gap: 12 (ref 5–15)
BUN: 11 mg/dL (ref 6–20)
CO2: 27 mmol/L (ref 22–32)
Calcium: 8.9 mg/dL (ref 8.9–10.3)
Chloride: 99 mmol/L — ABNORMAL LOW (ref 101–111)
Creatinine, Ser: 3.06 mg/dL — ABNORMAL HIGH (ref 0.61–1.24)
GFR calc Af Amer: 25 mL/min — ABNORMAL LOW (ref >60)
GFR calc non Af Amer: 21 mL/min — ABNORMAL LOW (ref >60)
Glucose, Bld: 89 mg/dL (ref 65–99)
Potassium: 3 mmol/L — ABNORMAL LOW (ref 3.5–5.1)
Sodium: 138 mmol/L (ref 135–145)
Total Bilirubin: 0.9 mg/dL (ref 0.3–1.2)
Total Protein: 7 g/dL (ref 6.5–8.1)

## 2017-08-29 LAB — I-STAT CG4 LACTIC ACID, ED: Lactic Acid, Venous: 0.9 mmol/L (ref 0.5–1.9)

## 2017-08-29 NOTE — ED Triage Notes (Signed)
Pt is a dialysis pt and has had fever and body aches since last Monday.  Reports Pt had blood cultures drawn on 12/18 and has received iv antibiotics during dialysis on 12/17, 19, 21, and 23.    Pt alert and oriented.  Received ceftazidime and vancomycin today.

## 2017-08-29 NOTE — Consult Note (Signed)
Requested by ER and nephrology to remove permacath due to patient having fevers.  Informed consent obtained from patient.  Permacath removed at bedside without difficulty.  Tip sent for culture.  Pressure held.  No hematoma at the end of the procedure.  Patient tolerated procedure well.

## 2017-08-29 NOTE — ED Provider Notes (Addendum)
[facility] EMERGENCY DEPARTMENT Provider Note   CSN: [id number] Arrival date & time: 08/29/17  1200     History   Chief Complaint Chief Complaint  Patient presents with  . Fever    HPI Justin Barrett is a 56 y.o. male.  Patient with known end-stage renal disease on dialysis Monday, Wednesday, Friday presents with fever and body aches since last Monday.  Recent blood cultures were positive and patient has received intravenous antibiotics on 1217, 19, 21, 23.  He is still running a fever with chills.  He received ceftazidime and vancomycin today.  His local nephrologist Dr. Lowanda Foster states he needs to have his central catheter removed.  This catheter was placed in the [facility] system in February 2017.  Patient is alert and ambulatory.  No chest pain, dyspnea, neuro deficits      Past Medical History:  Diagnosis Date  . End stage renal failure on dialysis ([place])   . HTN (hypertension)     Patient Active Problem List   Diagnosis Date Noted  . HTN (hypertension) 07/17/2014  . Encounter for screening colonoscopy 07/17/2014    Past Surgical History:  Procedure Laterality Date  . APPENDECTOMY  2010  . AV FISTULA PLACEMENT         Home Medications    Prior to Admission medications   Medication Sig Start Date End Date Taking? Authorizing Provider  cefUROXime (CEFTIN) 500 MG tablet Take 500 mg by mouth daily. 04/10/17  Yes [provider]  amLODipine (NORVASC) 10 MG tablet Take 10 mg by mouth daily.    [provider]  cloNIDine (CATAPRES) 0.2 MG tablet Take 1 tablet by mouth daily.  01/04/15   [provider]  HYDROcodone-acetaminophen (NORCO/VICODIN) 5-325 MG tablet Take 1-2 tablets by mouth every 6 (six) hours as needed. 04/06/17   Horton, Barbette Hair, MD  lisinopril (PRINIVIL,ZESTRIL) 40 MG tablet Take 0.5 tablets by mouth daily. 08/02/17   [provider]  naproxen (NAPROSYN) 500 MG tablet Take 1 tablet (500 mg total) by mouth 2 (two)  times daily. 01/11/15   Fredia Sorrow, MD  SODIUM BICARBONATE PO Take 1 tablet by mouth daily.    [provider]    Family History Family History  Problem Relation Age of Onset  . Colon cancer Neg Hx        not known    Social History Social History   Tobacco Use  . Smoking status: Never Smoker  . Smokeless tobacco: Never Used  Substance Use Topics  . Alcohol use: No    Alcohol/week: 0.0 oz  . Drug use: No     Allergies   Patient has no known allergies.   Review of Systems Review of Systems  All other systems reviewed and are negative.    Physical Exam Updated Vital Signs BP (!) 147/90 (BP Location: Right Arm)   Pulse 83   Temp (!) 101 F (38.3 C) (Oral)   Resp 20   Ht 5\' 11"  (1.803 m)   Wt 79.4 kg (175 lb)   SpO2 94%   BMI 24.41 kg/m   Physical Exam  Constitutional: He is oriented to person, place, and time. He appears well-developed and well-nourished.  Nontoxic-appearing, febrile  HENT:  Head: Normocephalic and atraumatic.  Eyes: Conjunctivae are normal.  Neck: Neck supple.  Cardiovascular: Normal rate and regular rhythm.  Pulmonary/Chest: Effort normal and breath sounds normal.  Catheter right anterior chest wall  Abdominal: Soft. Bowel sounds are normal.  Musculoskeletal:  Normal range of motion.  Neurological: He is alert and oriented to person, place, and time.  Skin: Skin is warm and dry.  Psychiatric: He has a normal mood and affect. His behavior is normal.  Nursing note and vitals reviewed.    ED Treatments / Results  Labs (all labs ordered are listed, but only abnormal results are displayed) Labs Reviewed  COMPREHENSIVE METABOLIC PANEL - Abnormal; Notable for the following components:      Result Value   Potassium 3.0 (*)    Chloride 99 (*)    Creatinine, Ser 3.06 (*)    Albumin 3.2 (*)    ALT 11 (*)    GFR calc non Af Amer 21 (*)    GFR calc Af Amer 25 (*)    All other components within normal limits  CBC WITH  DIFFERENTIAL/PLATELET - Abnormal; Notable for the following components:   RBC 4.09 (*)    Hemoglobin 11.1 (*)    HCT 33.6 (*)    Neutro Abs 7.9 (*)    Lymphs Abs 0.5 (*)    All other components within normal limits  CULTURE, BLOOD (ROUTINE X 2)  CULTURE, BLOOD (ROUTINE X 2)  URINALYSIS, ROUTINE W REFLEX MICROSCOPIC  I-STAT CG4 LACTIC ACID, ED  I-STAT CG4 LACTIC ACID, ED    EKG  EKG Interpretation None       Radiology Dg Chest 2 View  Result Date: 08/29/2017 CLINICAL DATA:  One-week history of intermittent fever, chills, nausea and vomiting and myalgias. Current history of end-stage renal disease on hemodialysis. EXAM: CHEST  2 VIEW COMPARISON:  Chest x-rays 04/05/2017, 01/11/2015. CTA chest 04/05/2017. FINDINGS: AP erect and lateral images were obtained. Cardiac silhouette mildly enlarged, unchanged. Thoracic aorta tortuous, unchanged. Hilar and mediastinal contours otherwise unremarkable. Right jugular dialysis catheter tips at or just below the cavoatrial junction, unchanged. Small bilateral pleural effusions, best seen on the lateral image. Lungs clear. Bronchovascular markings normal. Mild pulmonary venous hypertension without overt edema. Visualized bony thorax intact. IMPRESSION: 1. Small bilateral pleural effusions. No acute cardiopulmonary disease otherwise. 2. Stable mild cardiomegaly without evidence of pulmonary edema. Electronically Signed   By: Evangeline Dakin M.D.   On: 08/29/2017 12:58    Procedures Procedures (including critical care time)  Medications Ordered in ED Medications - No data to display   Initial Impression / Assessment and Plan / ED Course  I have reviewed the triage vital signs and the nursing notes.  Pertinent labs & imaging results that were available during my care of the patient were reviewed by me and considered in my medical decision making (see chart for details).     I discussed the clinical scenario with the nephrologist on-call and  the general surgeon on-call Dr. Aviva Signs. Dr. Arnoldo Morale will consult on patient.  Chest catheter removed by general surgeon.  Patient was observed for approximately 1 hour after procedure.  No bleeding was noted.  Patient will be discharged to follow-up with nephrology for further recommendations regarding IV access and IV antibiotics.  Final Clinical Impressions(s) / ED Diagnoses   Final diagnoses:  Fever, unspecified fever cause    ED Discharge Orders    None       Nat Christen, MD 08/29/17 1451    Nat Christen, MD 08/29/17 1921

## 2017-08-29 NOTE — Discharge Instructions (Signed)
Follow up with dialysis center tomorrow or call the kidney doctor tomorrow for further instructions regarding the insertion of a new catheter.  Phone number given.

## 2017-08-30 ENCOUNTER — Other Ambulatory Visit (HOSPITAL_COMMUNITY): Payer: Self-pay | Admitting: Nephrology

## 2017-08-30 ENCOUNTER — Other Ambulatory Visit: Payer: Self-pay | Admitting: Radiology

## 2017-08-30 DIAGNOSIS — N186 End stage renal disease: Secondary | ICD-10-CM

## 2017-08-30 DIAGNOSIS — Z992 Dependence on renal dialysis: Principal | ICD-10-CM

## 2017-09-01 ENCOUNTER — Ambulatory Visit (HOSPITAL_COMMUNITY)
Admission: RE | Admit: 2017-09-01 | Discharge: 2017-09-01 | Disposition: A | Discharge status: Home / Self Care | Payer: No Typology Code available for payment source | Source: Ambulatory Visit | Attending: Nephrology | Admitting: Nephrology

## 2017-09-01 ENCOUNTER — Encounter (HOSPITAL_COMMUNITY): Payer: Self-pay

## 2017-09-01 ENCOUNTER — Other Ambulatory Visit (HOSPITAL_COMMUNITY): Payer: Self-pay | Admitting: Nephrology

## 2017-09-01 DIAGNOSIS — N186 End stage renal disease: Secondary | ICD-10-CM | POA: Diagnosis not present

## 2017-09-01 DIAGNOSIS — Z992 Dependence on renal dialysis: Secondary | ICD-10-CM | POA: Diagnosis not present

## 2017-09-01 DIAGNOSIS — I120 Hypertensive chronic kidney disease with stage 5 chronic kidney disease or end stage renal disease: Secondary | ICD-10-CM | POA: Insufficient documentation

## 2017-09-01 HISTORY — PX: IR FLUORO GUIDE CV LINE LEFT: IMG2282

## 2017-09-01 HISTORY — PX: IR US GUIDE VASC ACCESS LEFT: IMG2389

## 2017-09-01 LAB — PROTIME-INR
INR: 1.01
Prothrombin Time: 13.2 seconds (ref 11.4–15.2)

## 2017-09-01 LAB — CBC
HCT: 35.5 % — ABNORMAL LOW (ref 39.0–52.0)
HEMOGLOBIN: 11.8 g/dL — AB (ref 13.0–17.0)
MCH: 27 pg (ref 26.0–34.0)
MCHC: 33.2 g/dL (ref 30.0–36.0)
MCV: 81.2 fL (ref 78.0–100.0)
PLATELETS: 91 10*3/uL — AB (ref 150–400)
RBC: 4.37 MIL/uL (ref 4.22–5.81)
RDW: 13.3 % (ref 11.5–15.5)
WBC: 4.6 10*3/uL (ref 4.0–10.5)

## 2017-09-01 LAB — BASIC METABOLIC PANEL
Anion gap: 9 (ref 5–15)
BUN: 51 mg/dL — ABNORMAL HIGH (ref 6–20)
CHLORIDE: 107 mmol/L (ref 101–111)
CO2: 24 mmol/L (ref 22–32)
CREATININE: 8.83 mg/dL — AB (ref 0.61–1.24)
Calcium: 9.9 mg/dL (ref 8.9–10.3)
GFR calc non Af Amer: 6 mL/min — ABNORMAL LOW (ref >60)
GFR, EST AFRICAN AMERICAN: 7 mL/min — AB (ref >60)
GLUCOSE: 93 mg/dL (ref 65–99)
Potassium: 4.4 mmol/L (ref 3.5–5.1)
Sodium: 140 mmol/L (ref 135–145)

## 2017-09-01 LAB — APTT: aPTT: 29 seconds (ref 24–36)

## 2017-09-01 MED ORDER — FENTANYL CITRATE (PF) 100 MCG/2ML IJ SOLN
INTRAMUSCULAR | Status: AC | PRN
  Administered 2017-09-01: 50 ug via INTRAVENOUS
  Administered 2017-09-01 (×2): 25 ug via INTRAVENOUS
  Administered 2017-09-01: 50 ug via INTRAVENOUS

## 2017-09-01 MED ORDER — MIDAZOLAM HCL 2 MG/2ML IJ SOLN
INTRAMUSCULAR | Status: AC | PRN
  Administered 2017-09-01 (×4): 1 mg via INTRAVENOUS

## 2017-09-01 MED ORDER — FENTANYL CITRATE (PF) 100 MCG/2ML IJ SOLN
INTRAMUSCULAR | Status: AC
  Filled 2017-09-01: qty 2

## 2017-09-01 MED ORDER — SODIUM CHLORIDE 0.9 % IV SOLN: INTRAVENOUS | Status: DC

## 2017-09-01 MED ORDER — CEFAZOLIN SODIUM-DEXTROSE 2-4 GM/100ML-% IV SOLN
INTRAVENOUS | Status: AC
  Filled 2017-09-01: qty 100

## 2017-09-01 MED ORDER — LIDOCAINE HCL 1 % IJ SOLN
INTRAMUSCULAR | Status: AC
  Filled 2017-09-01: qty 20

## 2017-09-01 MED ORDER — HEPARIN SODIUM (PORCINE) 1000 UNIT/ML IJ SOLN
INTRAMUSCULAR | Status: AC
  Filled 2017-09-01: qty 1

## 2017-09-01 MED ORDER — MIDAZOLAM HCL 2 MG/2ML IJ SOLN
INTRAMUSCULAR | Status: AC
  Filled 2017-09-01: qty 2

## 2017-09-01 MED ORDER — CEFAZOLIN SODIUM-DEXTROSE 2-4 GM/100ML-% IV SOLN
2.0000 g | INTRAVENOUS | Status: AC
  Administered 2017-09-01: 2 g via INTRAVENOUS

## 2017-09-01 MED ORDER — SODIUM CHLORIDE 0.9 % IV SOLN
INTRAVENOUS | Status: AC | PRN
  Administered 2017-09-01: 10 mL/h via INTRAVENOUS

## 2017-09-01 MED ORDER — LIDOCAINE HCL 1 % IJ SOLN
INTRAMUSCULAR | Status: AC | PRN
  Administered 2017-09-01: 10 mL

## 2017-09-01 NOTE — Procedures (Addendum)
  Procedure: L IJ tunneled  HD catheter plaement 23cm Preprocedure diagnosis: esrd Postprocedure diagnosis: same EBL:   minimal Complications:  none immediate  See full dictation in [facility].  Dillard Cannon MD Main # [phone] Pager  [phone]

## 2017-09-01 NOTE — H&P (Signed)
Chief Complaint: Patient was seen in consultation today for tunneled Hemodialysis catheter placement at the request of [facility]  Referring Physician(s): Fran Lowes  Supervising Physician: Arne Cleveland  Patient Status: [name] - Out-pt  History of Present Illness: Justin Barrett is a 56 y.o. male   ESRD Follows with Dr Lowanda Foster Last dialysis 12/23---[facility] Right IJ tunneled cath placed in [facility] 10/2015 per pt  Had been experiencing fever; chills nausea for days Per pt: blood cultures were performed at Dialysis center Started antibiotic treatments 12/17; 19; 21; and 23 Informed of + [facility] 12/23 Was still experiencing fever; chills To ED at [facility] 12/23 Perm cath was removed on OR at [facility] 12/23 Requested by ER and nephrology to remove permacath due to patient having fevers.  Informed consent obtained from patient.  Permacath removed at bedside without difficulty.  Tip sent for culture.  Pressure held.  No hematoma at the end of the procedure.  Patient tolerated procedure well.  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 End stage renal failure on dialysis ([name])   . HTN (hypertension)     Past Surgical History:  Procedure Laterality Date  . APPENDECTOMY  2010  . AV FISTULA PLACEMENT      Allergies: Patient has no known allergies.  Medications: Prior to Admission medications   Medication Sig Start Date End Date Taking? Authorizing Provider  aspirin 325 MG tablet Take 325 mg by mouth every 6 (six) hours as needed for mild pain or moderate pain.   Yes [provider]  cloNIDine (CATAPRES) 0.2 MG tablet Take 2 tablets by mouth daily.  01/04/15  Yes [provider]  HYDROcodone-acetaminophen (NORCO/VICODIN) 5-325 MG tablet Take 1-2 tablets by mouth  every 6 (six) hours as needed. 04/06/17  Yes Horton, Barbette Hair, MD  SODIUM BICARBONATE PO Take 1 tablet by mouth 2 (two) times daily.    Yes [provider]  UNKNOWN TO PATIENT Take 2 tablets by mouth daily. For nausea-starts with an "L" per the patient    [provider]     Family History  Problem Relation Age of Onset  . Colon cancer Neg Hx        not known    Social History   Socioeconomic History  . Marital status: Single    Spouse name: None  . Number of children: 0  . Years of education: None  . Highest education level: None  Social Needs  . Financial resource strain: None  . Food insecurity - worry: None  . Food insecurity - inability: None  . Transportation needs - medical: None  . Transportation needs - non-medical: None  Occupational History  . Occupation: [employer]  Tobacco Use  . Smoking status: Never Smoker  . Smokeless tobacco: Never Used  Substance and Sexual Activity  . Alcohol use: No    Alcohol/week: 0.0 oz  . Drug use: No  . Sexual activity: None  Other Topics Concern  . None  Social History Narrative  . None    Review of Systems: A 12 point ROS discussed and pertinent positives are indicated in the HPI above.  All other systems are negative.  Review of Systems  Constitutional: Positive for fatigue. Negative for activity change and fever.  Respiratory: Negative for cough and shortness  of breath.   Cardiovascular: Negative for chest pain.  Gastrointestinal: Positive for nausea.  Neurological: Positive for weakness.  Psychiatric/Behavioral: Negative for behavioral problems and confusion.    Vital Signs: BP (!) 164/105   Pulse 62   Temp (!) 97.5 F (36.4 C)   Resp 20   Ht 5\' 11"  (1.803 m)   Wt 175 lb (79.4 kg)   SpO2 100%   BMI 24.41 kg/m   Physical Exam  Constitutional: He is oriented to person, place, and time.  Cardiovascular: Normal rate and regular rhythm.  Pulmonary/Chest: Effort normal.    Abdominal: Soft. Bowel sounds are normal.  Musculoskeletal: Normal range of motion.  Neurological: He is alert and oriented to person, place, and time.  Skin: Skin is warm and dry.  Healing Rt IJ tunneled catheter removal site Clean and dry  Psychiatric: He has a normal mood and affect. His behavior is normal. Judgment and thought content normal.  Nursing note and vitals reviewed.   Imaging: Dg Chest 2 View  Result Date: 08/29/2017 CLINICAL DATA:  One-week history of intermittent fever, chills, nausea and vomiting and myalgias. Current history of end-stage renal disease on hemodialysis. EXAM: CHEST  2 VIEW COMPARISON:  Chest x-rays 04/05/2017, 01/11/2015. CTA chest 04/05/2017. FINDINGS: AP erect and lateral images were obtained. Cardiac silhouette mildly enlarged, unchanged. Thoracic aorta tortuous, unchanged. Hilar and mediastinal contours otherwise unremarkable. Right jugular dialysis catheter tips at or just below the cavoatrial junction, unchanged. Small bilateral pleural effusions, best seen on the lateral image. Lungs clear. Bronchovascular markings normal. Mild pulmonary venous hypertension without overt edema. Visualized bony thorax intact. IMPRESSION: 1. Small bilateral pleural effusions. No acute cardiopulmonary disease otherwise. 2. Stable mild cardiomegaly without evidence of pulmonary edema. Electronically Signed   By: Evangeline Dakin M.D.   On: 08/29/2017 12:58    Labs:  CBC: Recent Labs    04/05/17 2318 08/29/17 1212 09/01/17 1032  WBC  --  8.6 4.6  HGB 12.9* 11.1* 11.8*  HCT 38.0* 33.6* 35.5*  PLT  --  220 PENDING    COAGS: Recent Labs    09/01/17 1132  INR 1.01  APTT 29    BMP: Recent Labs    04/05/17 2318 08/29/17 1212 09/01/17 1032  NA 138 138 140  K 3.5 3.0* 4.4  CL 99* 99* 107  CO2  --  27 24  GLUCOSE 97 89 93  BUN 27* 11 51*  CALCIUM  --  8.9 9.9  CREATININE 5.40* 3.06* 8.83*  GFRNONAA  --  21* 6*  GFRAA  --  25* 7*    LIVER FUNCTION  TESTS: Recent Labs    08/29/17 1212  BILITOT 0.9  AST 15  ALT 11*  ALKPHOS 73  PROT 7.0  ALBUMIN 3.2*    TUMOR MARKERS: No results for input(s): AFPTM, CEA, CA199, CHROMGRNA in the last 8760 hours.  Assessment and Plan:  Rt IJ tunneled dialysis catheter removal secondary infection 12/23 Now for replacement Risks and benefits discussed with the patient including, but not limited to bleeding, infection, vascular injury, pneumothorax which may require chest tube placement, air embolism or even death All of the patient's questions were answered, patient is agreeable to proceed. Consent signed and in chart.   Thank you for this interesting consult.  I greatly enjoyed meeting CHRISTOPHERJOHN SCHIELE and look forward to participating in their care.  A copy of this report was sent to the requesting provider on this date.  Electronically Signed: Lavonia Drafts, PA-C 09/01/2017,  12:07 PM   I spent a total of  30 Minutes   in face to face in clinical consultation, greater than 50% of which was counseling/coordinating care for tunneled dialysis catheter placdment

## 2017-09-01 NOTE — Discharge Instructions (Addendum)
Central Line Dialysis Access Placement, Care After °This sheet gives you information about how to care for yourself after your procedure. Your health care provider may also give you more specific instructions. If you have problems or questions, contact your health care provider. °What can I expect after the procedure? °After the procedure, it is common to have: °· Mild pain or discomfort. °· Mild redness, swelling, or bruising around your incision. °· A small amount of blood or clear fluid coming from your incision. ° °Follow these instructions at home: °Incision care °· Follow instructions from your health care provider about how to take care of your incision. Make sure you: °? Wash your hands with soap and water before you change your bandage (dressing). If soap and water are not available, use hand sanitizer. °? Change your dressing as told by your health care provider. °? Leave stitches (sutures) in place. °· Check your incision area every day for signs of infection. Check for: °? More redness, swelling, or pain. °? More fluid or blood. °? Warmth. °? Pus or a bad smell. °· If directed, put heat on the catheter site as often as told by your health care provider. Use the heat source that your health care provider recommends, such as a moist heat pack or a heating pad. °? Place a towel between your skin and the heat source. °? Leave the heat on for 20-30 minutes. °? Remove the heat if your skin turns bright red. This is especially important if you are unable to feel pain, heat, or cold. You may have a greater risk of getting burned. °· If directed, put ice on the catheter site: °? Put ice in a plastic bag. °? Place a towel between your skin and the bag. °? Leave the ice on for 20 minutes, 2-3 times a day. °Medicines °· Take over-the-counter and prescription medicines only as told by your health care provider. °· If you were prescribed an antibiotic medicine, use it as told by your health care provider. Do not stop  using the antibiotic even if you start to feel better. °Activity °· Return to your normal activities as told by your health care provider. Ask your health care provider what activities are safe for you. °· Do not lift anything that is heavier than 10 lb (4.5 kg) until your health care provider says that this is safe. °Driving °· Do not drive for 24 hours if you were given a medicine to help you relax (sedative) during your procedure. °· Do not drive or use heavy machinery while taking prescription pain medicine. °Lifestyle °· Limit alcohol intake to no more than 1 drink a day for nonpregnant women and 2 drinks a day for men. One drink equals 12 oz of beer, 5 oz of wine, or 1½ oz of hard liquor. °· Do not use any products that contain nicotine or tobacco, such as cigarettes and e-cigarettes. If you need help quitting, ask your health care provider. °General instructions °· Do not take baths or showers, swim, or use a hot tub until your health care provider approves. You may only be allowed to take sponge baths for bathing. °· Wear compression stockings as told by your health care provider. These stockings help to prevent blood clots and reduce swelling in your legs. °· Follow instructions from your health care provider about eating or drinking restrictions. °· Keep all follow-up visits as told by your health care provider. This is important. °Contact a health care provider if: °· Your   catheter gets pulled out of place. °· Your catheter site becomes itchy. °· You develop a rash around your catheter site. °· You have more redness, swelling, or pain around your incision. °· You have more fluid or blood coming from your incision. °· Your incision area feels warm to the touch. °· You have pus or a bad smell coming from your incision. °· You have a fever. °Get help right away if: °· You become light-headed or dizzy. °· You faint. °· You have difficulty breathing. °· Your catheter gets pulled out completely. °This  information is not intended to replace advice given to you by your health care provider. Make sure you discuss any questions you have with your health care provider. °Document Released: 04/07/2004 Document Revised: 05/18/2016 Document Reviewed: 05/18/2016 °Elsevier Interactive Patient Education © 2018 [employer] °Moderate Conscious Sedation, Adult, Care After °These instructions provide you with information about caring for yourself after your procedure. Your health care provider may also give you more specific instructions. Your treatment has been planned according to current medical practices, but problems sometimes occur. Call your health care provider if you have any problems or questions after your procedure. °What can I expect after the procedure? °After your procedure, it is common: °· To feel sleepy for several hours. °· To feel clumsy and have poor balance for several hours. °· To have poor judgment for several hours. °· To vomit if you eat too soon. ° °Follow these instructions at home: °For at least 24 hours after the procedure: ° °· Do not: °? Participate in activities where you could fall or become injured. °? Drive. °? Use heavy machinery. °? Drink alcohol. °? Take sleeping pills or medicines that cause drowsiness. °? Make important decisions or sign legal documents. °? Take care of children on your own. °· Rest. °Eating and drinking °· Follow the diet recommended by your health care provider. °· If you vomit: °? Drink water, juice, or soup when you can drink without vomiting. °? Make sure you have little or no nausea before eating solid foods. °General instructions °· Have a responsible adult stay with you until you are awake and alert. °· Take over-the-counter and prescription medicines only as told by your health care provider. °· If you smoke, do not smoke without supervision. °· Keep all follow-up visits as told by your health care provider. This is important. °Contact a health care provider  if: °· You keep feeling nauseous or you keep vomiting. °· You feel light-headed. °· You develop a rash. °· You have a fever. °Get help right away if: °· You have trouble breathing. °This information is not intended to replace advice given to you by your health care provider. Make sure you discuss any questions you have with your health care provider. °Document Released: 06/14/2013 Document Revised: 01/27/2016 Document Reviewed: 12/14/2015 °Elsevier Interactive Patient Education © 2018 [employer] ° °

## 2017-09-02 ENCOUNTER — Other Ambulatory Visit (HOSPITAL_COMMUNITY)
Admission: RE | Admit: 2017-09-02 | Discharge: 2017-09-02 | Disposition: A | Discharge status: Home / Self Care | Payer: Non-veteran care | Source: Ambulatory Visit | Attending: Medical | Admitting: Medical

## 2017-09-02 DIAGNOSIS — E875 Hyperkalemia: Secondary | ICD-10-CM | POA: Diagnosis present

## 2017-09-02 LAB — CATH TIP CULTURE: Culture: NO GROWTH

## 2017-09-02 LAB — POTASSIUM: POTASSIUM: 3.9 mmol/L (ref 3.5–5.1)

## 2017-09-03 LAB — CULTURE, BLOOD (ROUTINE X 2)
Culture: NO GROWTH
Culture: NO GROWTH
Special Requests: ADEQUATE
Special Requests: ADEQUATE

## 2017-10-20 NOTE — Progress Notes (Signed)
REVIEWED-NO ADDITIONAL RECOMMENDATIONS. 

## 2017-11-30 ENCOUNTER — Encounter (HOSPITAL_COMMUNITY): Payer: Self-pay | Admitting: Emergency Medicine

## 2017-11-30 ENCOUNTER — Emergency Department (HOSPITAL_COMMUNITY)
Admission: EM | Admit: 2017-11-30 | Discharge: 2017-11-30 | Disposition: A | Discharge status: Home / Self Care | Payer: Non-veteran care | Attending: Emergency Medicine | Admitting: Emergency Medicine

## 2017-11-30 ENCOUNTER — Other Ambulatory Visit: Payer: Self-pay

## 2017-11-30 DIAGNOSIS — Z992 Dependence on renal dialysis: Secondary | ICD-10-CM | POA: Insufficient documentation

## 2017-11-30 DIAGNOSIS — I120 Hypertensive chronic kidney disease with stage 5 chronic kidney disease or end stage renal disease: Secondary | ICD-10-CM | POA: Insufficient documentation

## 2017-11-30 DIAGNOSIS — N186 End stage renal disease: Secondary | ICD-10-CM | POA: Diagnosis not present

## 2017-11-30 DIAGNOSIS — M545 Low back pain: Secondary | ICD-10-CM | POA: Diagnosis present

## 2017-11-30 DIAGNOSIS — Z79899 Other long term (current) drug therapy: Secondary | ICD-10-CM | POA: Insufficient documentation

## 2017-11-30 DIAGNOSIS — M5441 Lumbago with sciatica, right side: Secondary | ICD-10-CM | POA: Insufficient documentation

## 2017-11-30 MED ORDER — HYDROMORPHONE HCL 1 MG/ML IJ SOLN
1.0000 mg | Freq: Once | INTRAMUSCULAR | Status: AC
  Administered 2017-11-30: 1 mg via INTRAMUSCULAR
  Filled 2017-11-30: qty 1

## 2017-11-30 NOTE — Discharge Instructions (Addendum)
Follow-up with dialysis as scheduled.  Pain medication given here today will last into your dialyzed tomorrow.  Additional workup will be needed at the back pain persists.

## 2017-11-30 NOTE — ED Provider Notes (Signed)
[facility] EMERGENCY DEPARTMENT Provider Note   CSN: [id number] Arrival date & time: 11/30/17  6160     History   Chief Complaint Chief Complaint  Patient presents with  . Back Pain    HPI Justin Barrett is a 57 y.o. male.  Note already completed this is a duplicate note.     Past Medical History:  Diagnosis Date  . End stage renal failure on dialysis ([place])   . HTN (hypertension)     Patient Active Problem List   Diagnosis Date Noted  . ESRD (end stage renal disease) ([place])   . Fever   . HTN (hypertension) 07/17/2014  . Encounter for screening colonoscopy 07/17/2014    Past Surgical History:  Procedure Laterality Date  . APPENDECTOMY  2010  . AV FISTULA PLACEMENT    . IR FLUORO GUIDE CV LINE LEFT  09/01/2017  . IR US GUIDE VASC ACCESS LEFT  09/01/2017        Home Medications    Prior to Admission medications   Medication Sig Start Date End Date Taking? Authorizing Provider  aspirin 325 MG tablet Take 325 mg by mouth every 6 (six) hours as needed for mild pain or moderate pain.   Yes [provider]  cloNIDine (CATAPRES) 0.2 MG tablet Take 2 tablets by mouth daily.  01/04/15  Yes [provider]  HYDROcodone-acetaminophen (NORCO/VICODIN) 5-325 MG tablet Take 1-2 tablets by mouth every 6 (six) hours as needed. 04/06/17  Yes Horton, Barbette Hair, MD  lisinopril (PRINIVIL,ZESTRIL) 40 MG tablet Take 20 mg by mouth daily.   Yes [provider]  sevelamer carbonate (RENVELA) 800 MG tablet Take 2,400 mg by mouth 3 (three) times daily with meals.   Yes [provider]  SODIUM BICARBONATE PO Take 1 tablet by mouth 2 (two) times daily.    Yes [provider]    Family History Family History  Problem Relation Age of Onset  . Colon cancer Neg Hx        not known    Social History Social History   Tobacco Use  . Smoking status: Never Smoker  . Smokeless tobacco: Never Used  Substance Use Topics  . Alcohol use: No    Alcohol/week: 0.0 oz  . Drug use: No     Allergies   Patient has no known allergies.   Review of Systems Review of Systems   Physical Exam Updated Vital Signs BP (!) 163/104   Pulse 67   Temp 97.9 F (36.6 C) (Oral)   Resp 14   Ht 1.803 m (5\' 11" )   Wt 77.1 kg (170 lb)   SpO2 97%   BMI 23.71 kg/m   Physical Exam   ED Treatments / Results  Labs (all labs ordered are listed, but only abnormal results are displayed) Labs Reviewed - No data to display  EKG None  Radiology No results found.  Procedures Procedures (including critical care time)  Medications Ordered in ED Medications  HYDROmorphone (DILAUDID) injection 1 mg (1 mg Intramuscular Given 11/30/17 0739)     Initial Impression / Assessment and Plan / ED Course  I have reviewed the triage vital signs and the nursing notes.  Pertinent labs & imaging results that were available during my care of the patient were reviewed by me and considered in my medical decision making (see chart for details).       Final Clinical Impressions(s) / ED Diagnoses   Final diagnoses:  Acute left-sided low back  pain with right-sided sciatica    ED Discharge Orders    None       Fredia Sorrow, MD 12/02/17 [phone]

## 2017-11-30 NOTE — ED Triage Notes (Signed)
Pt c/o RT lower back pain that began last night. Denies injury/fall. Pain worsens with movement.

## 2017-11-30 NOTE — ED Provider Notes (Addendum)
[facility] EMERGENCY DEPARTMENT Provider Note   CSN: [id number] Arrival date & time: 11/30/17  6761     History   Chief Complaint Chief Complaint  Patient presents with  . Back Pain    HPI Justin Barrett is a 57 y.o. male.  Patient is a dialysis patient.  Normally dialyzed Monday Wednesdays and Fridays.  Was dialyzed yesterday.  Started with right low back pain last night.  No injury or fall.  Worse with movement.  No fevers no nausea or vomiting.  Does radiate into the right leg does not go down into the foot.     Past Medical History:  Diagnosis Date  . End stage renal failure on dialysis ([place])   . HTN (hypertension)     Patient Active Problem List   Diagnosis Date Noted  . ESRD (end stage renal disease) ([place])   . Fever   . HTN (hypertension) 07/17/2014  . Encounter for screening colonoscopy 07/17/2014    Past Surgical History:  Procedure Laterality Date  . APPENDECTOMY  2010  . AV FISTULA PLACEMENT    . IR FLUORO GUIDE CV LINE LEFT  09/01/2017  . IR US GUIDE VASC ACCESS LEFT  09/01/2017        Home Medications    Prior to Admission medications   Medication Sig Start Date End Date Taking? Authorizing Provider  aspirin 325 MG tablet Take 325 mg by mouth every 6 (six) hours as needed for mild pain or moderate pain.   Yes [provider]  cloNIDine (CATAPRES) 0.2 MG tablet Take 2 tablets by mouth daily.  01/04/15  Yes [provider]  HYDROcodone-acetaminophen (NORCO/VICODIN) 5-325 MG tablet Take 1-2 tablets by mouth every 6 (six) hours as needed. 04/06/17  Yes Horton, Barbette Hair, MD  lisinopril (PRINIVIL,ZESTRIL) 40 MG tablet Take 20 mg by mouth daily.   Yes [provider]  sevelamer carbonate (RENVELA) 800 MG tablet Take 2,400 mg by mouth 3 (three) times daily with meals.   Yes [provider]  SODIUM BICARBONATE PO Take 1 tablet by mouth 2 (two) times daily.    Yes [provider]    Family History Family  History  Problem Relation Age of Onset  . Colon cancer Neg Hx        not known    Social History Social History   Tobacco Use  . Smoking status: Never Smoker  . Smokeless tobacco: Never Used  Substance Use Topics  . Alcohol use: No    Alcohol/week: 0.0 oz  . Drug use: No     Allergies   Patient has no known allergies.   Review of Systems Review of Systems  Constitutional: Negative for fever.  HENT: Negative for congestion.   Eyes: Negative for redness.  Respiratory: Negative for shortness of breath.   Cardiovascular: Negative for chest pain.  Gastrointestinal: Negative for abdominal pain.  Musculoskeletal: Positive for back pain.  Neurological: Negative for headaches.  Hematological: Bruises/bleeds easily.  Psychiatric/Behavioral: Negative for confusion.     Physical Exam Updated Vital Signs BP (!) 165/100   Pulse 66   Temp 97.9 F (36.6 C) (Oral)   Resp 16   Ht 1.803 m (5\' 11" )   Wt 77.1 kg (170 lb)   SpO2 96%   BMI 23.71 kg/m   Physical Exam  Constitutional: He is oriented to person, place, and time. He appears well-developed and well-nourished. No distress.  HENT:  Head: Normocephalic and atraumatic.  Mouth/Throat: Oropharynx is clear  and moist.  Eyes: Pupils are equal, round, and reactive to light. Conjunctivae and EOM are normal.  Neck: Normal range of motion. Neck supple.  Cardiovascular: Normal rate, regular rhythm and normal heart sounds.  Pulmonary/Chest: Effort normal and breath sounds normal.  Abdominal: Soft. Bowel sounds are normal.  Musculoskeletal:  AV fistula right arm.  Good thrill.  No tenderness to palpation to the low back.  Neurological: He is alert and oriented to person, place, and time. No cranial nerve deficit. He exhibits normal muscle tone. Coordination normal.  Nursing note and vitals reviewed.    ED Treatments / Results  Labs (all labs ordered are listed, but only abnormal results are displayed) Labs Reviewed - No  data to display  EKG None  Radiology No results found.  Procedures Procedures (including critical care time)  Medications Ordered in ED Medications  HYDROmorphone (DILAUDID) injection 1 mg (1 mg Intramuscular Given 11/30/17 0739)     Initial Impression / Assessment and Plan / ED Course  I have reviewed the triage vital signs and the nursing notes.  Pertinent labs & imaging results that were available during my care of the patient were reviewed by me and considered in my medical decision making (see chart for details).    Patient without any respiratory distress.  Oxygen saturations 96% on room air.  Patient received IM pain medicine was seen significant improvement.  Patient aware that this will last until he is dialyzed tomorrow then may wear off.  May need to get back with his regular doctors for further pain management for the back pain.  Patient stable for discharge home.  Final Clinical Impressions(s) / ED Diagnoses   Final diagnoses:  Acute left-sided low back pain with right-sided sciatica    ED Discharge Orders    None       Fredia Sorrow, MD 11/30/17 1642    Fredia Sorrow, MD 12/02/17 1650

## 2017-11-30 NOTE — ED Notes (Signed)
EDP at bedside updating patient. 

## 2017-11-30 NOTE — ED Notes (Signed)
EDP at bedside  

## 2018-01-23 ENCOUNTER — Encounter (HOSPITAL_COMMUNITY): Payer: Self-pay | Admitting: Emergency Medicine

## 2018-01-23 ENCOUNTER — Other Ambulatory Visit: Payer: Self-pay

## 2018-01-23 ENCOUNTER — Emergency Department (HOSPITAL_COMMUNITY)
Admission: EM | Admit: 2018-01-23 | Discharge: 2018-01-23 | Disposition: A | Discharge status: Home / Self Care | Payer: Non-veteran care | Attending: Emergency Medicine | Admitting: Emergency Medicine

## 2018-01-23 DIAGNOSIS — R55 Syncope and collapse: Secondary | ICD-10-CM

## 2018-01-23 DIAGNOSIS — Z79899 Other long term (current) drug therapy: Secondary | ICD-10-CM | POA: Diagnosis not present

## 2018-01-23 DIAGNOSIS — N186 End stage renal disease: Secondary | ICD-10-CM | POA: Diagnosis not present

## 2018-01-23 DIAGNOSIS — Z992 Dependence on renal dialysis: Secondary | ICD-10-CM | POA: Diagnosis not present

## 2018-01-23 DIAGNOSIS — I120 Hypertensive chronic kidney disease with stage 5 chronic kidney disease or end stage renal disease: Secondary | ICD-10-CM | POA: Diagnosis not present

## 2018-01-23 LAB — CBC WITH DIFFERENTIAL/PLATELET
BASOS ABS: 0 10*3/uL (ref 0.0–0.1)
BASOS PCT: 1 %
EOS ABS: 0.5 10*3/uL (ref 0.0–0.7)
Eosinophils Relative: 13 %
HCT: 36.8 % — ABNORMAL LOW (ref 39.0–52.0)
HEMOGLOBIN: 12.1 g/dL — AB (ref 13.0–17.0)
LYMPHS ABS: 1.4 10*3/uL (ref 0.7–4.0)
Lymphocytes Relative: 36 %
MCH: 26.4 pg (ref 26.0–34.0)
MCHC: 32.9 g/dL (ref 30.0–36.0)
MCV: 80.2 fL (ref 78.0–100.0)
Monocytes Absolute: 0.3 10*3/uL (ref 0.1–1.0)
Monocytes Relative: 7 %
NEUTROS PCT: 43 %
Neutro Abs: 1.7 10*3/uL (ref 1.7–7.7)
Platelets: 273 10*3/uL (ref 150–400)
RBC: 4.59 MIL/uL (ref 4.22–5.81)
RDW: 16.2 % — ABNORMAL HIGH (ref 11.5–15.5)
WBC: 4 10*3/uL (ref 4.0–10.5)

## 2018-01-23 LAB — BASIC METABOLIC PANEL
ANION GAP: 8 (ref 5–15)
BUN: 40 mg/dL — ABNORMAL HIGH (ref 6–20)
CHLORIDE: 103 mmol/L (ref 101–111)
CO2: 26 mmol/L (ref 22–32)
Calcium: 9.9 mg/dL (ref 8.9–10.3)
Creatinine, Ser: 8.4 mg/dL — ABNORMAL HIGH (ref 0.61–1.24)
GFR, EST AFRICAN AMERICAN: 7 mL/min — AB (ref >60)
GFR, EST NON AFRICAN AMERICAN: 6 mL/min — AB (ref >60)
Glucose, Bld: 92 mg/dL (ref 65–99)
POTASSIUM: 3.8 mmol/L (ref 3.5–5.1)
SODIUM: 137 mmol/L (ref 135–145)

## 2018-01-23 LAB — CBG MONITORING, ED: GLUCOSE-CAPILLARY: 89 mg/dL (ref 65–99)

## 2018-01-23 MED ORDER — SODIUM CHLORIDE 0.9 % IV BOLUS
500.0000 mL | Freq: Once | INTRAVENOUS | Status: AC
  Administered 2018-01-23: 500 mL via INTRAVENOUS

## 2018-01-23 NOTE — Discharge Instructions (Addendum)
Tests were good.  Increase fluids.  Eat regular meals.  Follow-up with your primary care doctor.

## 2018-01-23 NOTE — ED Triage Notes (Signed)
Patient states had syncopal episode today while in church lasting approx 15-20 seconds. Patient states during prayer he became flushed suddenly and had LOC. Denies any chest pain or shortness of breath. Per patient he was assisted down into sitting postion by others. Denies hitting head. Denies any dizziness or blurred vision. 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 Dialysis patient-last dialyzed on Friday (Monday, Wednesday, and Friday).

## 2018-01-23 NOTE — ED Provider Notes (Signed)
[facility] EMERGENCY DEPARTMENT Provider Note   CSN: [id number] Arrival date & time: 01/23/18  1249     History   Chief Complaint Chief Complaint  Patient presents with  . Loss of Consciousness    HPI Justin Barrett is a 57 y.o. male.  Patient was at church this morning when he bent over during prayer and had a syncopal spell.  He states the church was not air conditioned and he was overheated.  No prodromal illnesses.  No chest pain, dyspnea, neurological deficits.  He is feeling back to normal now.     Past Medical History:  Diagnosis Date  . End stage renal failure on dialysis ([place])   . HTN (hypertension)     Patient Active Problem List   Diagnosis Date Noted  . ESRD (end stage renal disease) ([place])   . Fever   . HTN (hypertension) 07/17/2014  . Encounter for screening colonoscopy 07/17/2014    Past Surgical History:  Procedure Laterality Date  . APPENDECTOMY  2010  . AV FISTULA PLACEMENT    . IR FLUORO GUIDE CV LINE LEFT  09/01/2017  . IR US GUIDE VASC ACCESS LEFT  09/01/2017        Home Medications    Prior to Admission medications   Medication Sig Start Date End Date Taking? Authorizing Provider  cloNIDine (CATAPRES) 0.2 MG tablet Take 2 tablets by mouth daily.  01/04/15  Yes [provider]  lisinopril (PRINIVIL,ZESTRIL) 40 MG tablet Take 20 mg by mouth daily.   Yes [provider]  sevelamer carbonate (RENVELA) 800 MG tablet Take 2,400 mg by mouth 3 (three) times daily with meals.   Yes [provider]  sodium bicarbonate 650 MG tablet Take 1,300 mg by mouth daily.   Yes [provider]    Family History Family History  Problem Relation Age of Onset  . Colon cancer Neg Hx        not known    Social History Social History   Tobacco Use  . Smoking status: Never Smoker  . Smokeless tobacco: Never Used  Substance Use Topics  . Alcohol use: No    Alcohol/week: 0.0 oz  . Drug use: No     Allergies     Patient has no known allergies.   Review of Systems Review of Systems  All other systems reviewed and are negative.    Physical Exam Updated Vital Signs BP (!) 148/93   Pulse (!) 53   Temp 97.9 F (36.6 C) (Oral)   Resp 19   Ht 5\' 11"  (1.803 m)   Wt 74.8 kg (165 lb)   SpO2 97%   BMI 23.01 kg/m   Physical Exam  Constitutional: He is oriented to person, place, and time. He appears well-developed and well-nourished.  HENT:  Head: Normocephalic and atraumatic.  Eyes: Conjunctivae are normal.  Neck: Neck supple.  Cardiovascular: Normal rate and regular rhythm.  Pulmonary/Chest: Effort normal and breath sounds normal.  Abdominal: Soft. Bowel sounds are normal.  Musculoskeletal: Normal range of motion.  Neurological: He is alert and oriented to person, place, and time.  Skin: Skin is warm and dry.  Psychiatric: He has a normal mood and affect. His behavior is normal.  Nursing note and vitals reviewed.    ED Treatments / Results  Labs (all labs ordered are listed, but only abnormal results are displayed) Labs Reviewed  CBC WITH DIFFERENTIAL/PLATELET - Abnormal; Notable for the following components:  Result Value   Hemoglobin 12.1 (*)    HCT 36.8 (*)    RDW 16.2 (*)    All other components within normal limits  BASIC METABOLIC PANEL - Abnormal; Notable for the following components:   BUN 40 (*)    Creatinine, Ser 8.40 (*)    GFR calc non Af Amer 6 (*)    GFR calc Af Amer 7 (*)    All other components within normal limits  URINALYSIS, ROUTINE W REFLEX MICROSCOPIC  CBG MONITORING, ED    EKG None  Radiology No results found.  Procedures Procedures (including critical care time)  Medications Ordered in ED Medications  sodium chloride 0.9 % bolus 500 mL (500 mLs Intravenous New Bag/Given 01/23/18 1359)     Initial Impression / Assessment and Plan / ED Course  I have reviewed the triage vital signs and the nursing notes.  Pertinent labs & imaging  results that were available during my care of the patient were reviewed by me and considered in my medical decision making (see chart for details).     Patient had a syncopal spell at church.  His vital signs are stable.  EKG, labs [with the exception of creatinine] were all acceptable.  Fluid bolus given.  Patient was observed for approximately 90 minutes.  No ectopy on monitor  Final Clinical Impressions(s) / ED Diagnoses   Final diagnoses:  Syncope, unspecified syncope type  ESRD (end stage renal disease) [facility])    ED Discharge Orders    None       Nat Christen, MD 01/23/18 1435

## 2019-05-15 ENCOUNTER — Encounter (HOSPITAL_COMMUNITY): Payer: Self-pay

## 2019-05-15 ENCOUNTER — Other Ambulatory Visit: Payer: Self-pay

## 2019-05-15 ENCOUNTER — Emergency Department (HOSPITAL_COMMUNITY)
Admission: EM | Admit: 2019-05-15 | Discharge: 2019-05-15 | Disposition: A | Discharge status: Home / Self Care | Payer: No Typology Code available for payment source | Attending: Emergency Medicine | Admitting: Emergency Medicine

## 2019-05-15 DIAGNOSIS — N186 End stage renal disease: Secondary | ICD-10-CM | POA: Diagnosis not present

## 2019-05-15 DIAGNOSIS — I120 Hypertensive chronic kidney disease with stage 5 chronic kidney disease or end stage renal disease: Secondary | ICD-10-CM | POA: Diagnosis present

## 2019-05-15 DIAGNOSIS — I160 Hypertensive urgency: Secondary | ICD-10-CM | POA: Insufficient documentation

## 2019-05-15 DIAGNOSIS — Z79899 Other long term (current) drug therapy: Secondary | ICD-10-CM | POA: Diagnosis not present

## 2019-05-15 LAB — CBC WITH DIFFERENTIAL/PLATELET
Abs Immature Granulocytes: 0.01 10*3/uL (ref 0.00–0.07)
Basophils Absolute: 0 10*3/uL (ref 0.0–0.1)
Basophils Relative: 1 %
Eosinophils Absolute: 0.5 10*3/uL (ref 0.0–0.5)
Eosinophils Relative: 11 %
HCT: 45.7 % (ref 39.0–52.0)
Hemoglobin: 15.5 g/dL (ref 13.0–17.0)
Immature Granulocytes: 0 %
Lymphocytes Relative: 32 %
Lymphs Abs: 1.5 10*3/uL (ref 0.7–4.0)
MCH: 27.8 pg (ref 26.0–34.0)
MCHC: 33.9 g/dL (ref 30.0–36.0)
MCV: 81.9 fL (ref 80.0–100.0)
Monocytes Absolute: 0.4 10*3/uL (ref 0.1–1.0)
Monocytes Relative: 9 %
Neutro Abs: 2.3 10*3/uL (ref 1.7–7.7)
Neutrophils Relative %: 47 %
Platelets: 227 10*3/uL (ref 150–400)
RBC: 5.58 MIL/uL (ref 4.22–5.81)
RDW: 13.1 % (ref 11.5–15.5)
WBC: 4.7 10*3/uL (ref 4.0–10.5)
nRBC: 0 % (ref 0.0–0.2)

## 2019-05-15 LAB — BASIC METABOLIC PANEL
Anion gap: 11 (ref 5–15)
BUN: 25 mg/dL — ABNORMAL HIGH (ref 6–20)
CO2: 30 mmol/L (ref 22–32)
Calcium: 8.8 mg/dL — ABNORMAL LOW (ref 8.9–10.3)
Chloride: 97 mmol/L — ABNORMAL LOW (ref 98–111)
Creatinine, Ser: 6.54 mg/dL — ABNORMAL HIGH (ref 0.61–1.24)
GFR calc Af Amer: 10 mL/min — ABNORMAL LOW (ref >60)
GFR calc non Af Amer: 9 mL/min — ABNORMAL LOW (ref >60)
Glucose, Bld: 83 mg/dL (ref 70–99)
Potassium: 3.2 mmol/L — ABNORMAL LOW (ref 3.5–5.1)
Sodium: 138 mmol/L (ref 135–145)

## 2019-05-15 MED ORDER — HYDRALAZINE HCL 20 MG/ML IJ SOLN
10.0000 mg | Freq: Once | INTRAMUSCULAR | Status: AC
  Administered 2019-05-15: 18:00:00 10 mg via INTRAVENOUS
  Filled 2019-05-15: qty 1

## 2019-05-15 MED ORDER — CLONIDINE HCL 0.2 MG PO TABS: 0.3000 mg | ORAL_TABLET | Freq: Once | ORAL | Status: DC

## 2019-05-15 MED ORDER — HYDRALAZINE HCL 20 MG/ML IJ SOLN
10.0000 mg | Freq: Once | INTRAMUSCULAR | Status: AC
  Administered 2019-05-15: 10 mg via INTRAVENOUS
  Filled 2019-05-15: qty 1

## 2019-05-15 NOTE — Discharge Instructions (Addendum)
Keep a close watch on your blood pressures and discuss with your doctor any needed changes in your blood pressure medicines if you continue to have elevations.  Make sure to not miss any doses of your medicines.

## 2019-05-15 NOTE — ED Provider Notes (Signed)
[facility] EMERGENCY DEPARTMENT Provider Note   CSN: [id number] Arrival date & time: 05/15/19  1557     History   Chief Complaint Chief Complaint  Patient presents with  . Hypertension    HPI Justin Barrett is a 58 y.o. male with a history of HTN and ESRD on dialysis, finished his dialysis today and presents here secondary to severely elevated blood pressure while having his treatment, reporting bp was 260/144 during transportation here.  He denies any missed doses of his blood pressure meds, took clonidine 0.2 this am along with his lisinopril 40 mg and was given an additional clonidine tablet at dialysis without improvement. He reports a moderate frontal headache, denies blurred vision, cp, sob, worsened ankle edema, no dizziness and focal weakness.       HPI  Past Medical History:  Diagnosis Date  . End stage renal failure on dialysis ([place])   . HTN (hypertension)     Patient Active Problem List   Diagnosis Date Noted  . ESRD (end stage renal disease) ([place])   . Fever   . HTN (hypertension) 07/17/2014  . Encounter for screening colonoscopy 07/17/2014    Past Surgical History:  Procedure Laterality Date  . APPENDECTOMY  2010  . AV FISTULA PLACEMENT    . IR FLUORO GUIDE CV LINE LEFT  09/01/2017  . IR US GUIDE VASC ACCESS LEFT  09/01/2017        Home Medications    Prior to Admission medications   Medication Sig Start Date End Date Taking? Authorizing Provider  cloNIDine (CATAPRES) 0.2 MG tablet Take 2 tablets by mouth daily.  01/04/15   [provider]  lisinopril (PRINIVIL,ZESTRIL) 40 MG tablet Take 20 mg by mouth daily.    [provider]  sevelamer carbonate (RENVELA) 800 MG tablet Take 2,400 mg by mouth 3 (three) times daily with meals.    [provider]  sodium bicarbonate 650 MG tablet Take 1,300 mg by mouth daily.    [provider]    Family History Family History  Problem Relation Age of Onset  . Colon cancer Neg  Hx        not known    Social History Social History   Tobacco Use  . Smoking status: Never Smoker  . Smokeless tobacco: Never Used  Substance Use Topics  . Alcohol use: No    Alcohol/week: 0.0 standard drinks  . Drug use: No     Allergies   Patient has no known allergies.   Review of Systems Review of Systems  Constitutional: Negative for fever.  HENT: Negative for sore throat.   Eyes: Negative.  Negative for visual disturbance.  Respiratory: Negative for chest tightness and shortness of breath.   Cardiovascular: Negative for chest pain and palpitations.  Gastrointestinal: Negative for abdominal pain, nausea and vomiting.  Genitourinary: Negative.   Musculoskeletal: Negative for arthralgias, joint swelling and neck pain.  Skin: Negative.  Negative for rash and wound.  Neurological: Positive for headaches. Negative for dizziness, syncope, speech difficulty, weakness, light-headedness and numbness.  Psychiatric/Behavioral: Negative.      Physical Exam Updated Vital Signs BP (!) 144/83   Pulse 71   Temp 97.7 F (36.5 C) (Oral)   Resp (!) 21   Ht 5\' 11"  (1.803 m)   Wt 77.1 kg   SpO2 99%   BMI 23.71 kg/m   Physical Exam Vitals signs and nursing note reviewed.  Constitutional:      Appearance: Normal appearance. He  is well-developed. He is not ill-appearing.  HENT:     Head: Normocephalic and atraumatic.  Eyes:     Extraocular Movements: Extraocular movements intact.     Conjunctiva/sclera: Conjunctivae normal.     Pupils: Pupils are equal, round, and reactive to light.  Cardiovascular:     Rate and Rhythm: Regular rhythm. Bradycardia present.     Heart sounds: Normal heart sounds.  Pulmonary:     Effort: Pulmonary effort is normal.     Breath sounds: Normal breath sounds. No wheezing.  Abdominal:     General: Bowel sounds are normal.     Palpations: Abdomen is soft.     Tenderness: There is no abdominal tenderness.  Musculoskeletal: Normal range of  motion.  Skin:    General: Skin is warm and dry.  Neurological:     General: No focal deficit present.     Mental Status: He is alert and oriented to person, place, and time. Mental status is at baseline.      ED Treatments / Results  Labs (all labs ordered are listed, but only abnormal results are displayed) Labs Reviewed  BASIC METABOLIC PANEL - Abnormal; Notable for the following components:      Result Value   Potassium 3.2 (*)    Chloride 97 (*)    BUN 25 (*)    Creatinine, Ser 6.54 (*)    Calcium 8.8 (*)    GFR calc non Af Amer 9 (*)    GFR calc Af Amer 10 (*)    All other components within normal limits  CBC WITH DIFFERENTIAL/PLATELET    EKG EKG Interpretation  Date/Time:  Monday May 15 2019 16:10:03 EDT Ventricular Rate:  52 PR Interval:    QRS Duration: 96 QT Interval:  475 QTC Calculation: 442 R Axis:   19 Text Interpretation:  Sinus rhythm LVH with secondary repolarization abnormality Baseline wander in lead(s) II III aVR aVF V6 Confirmed by Virgel Manifold [phone]) on 05/15/2019 5:18:07 PM   Radiology No results found.  Procedures Procedures (including critical care time)  Medications Ordered in ED Medications  hydrALAZINE (APRESOLINE) injection 10 mg (10 mg Intravenous Given 05/15/19 1659)  hydrALAZINE (APRESOLINE) injection 10 mg (10 mg Intravenous Given 05/15/19 1821)     Initial Impression / Assessment and Plan / ED Course  I have reviewed the triage vital signs and the nursing notes.  Pertinent labs & imaging results that were available during my care of the patient were reviewed by me and considered in my medical decision making (see chart for details).        Pt with hypertensive crisis with resolution of headache with improved blood pressure after 2 doses of hydralazine.  Labs reviewed, ekg reviewed which are stable.  Creatinine elevation chronic.  Discussed close f/u with pcp or Dr. Hinda Lenis for bp recheck and medication adjustment if  bps continue to be elevated.   The patient appears reasonably screened and/or stabilized for discharge and I doubt any other medical condition or other [facility] requiring further screening, evaluation, or treatment in the ED at this time prior to discharge.   Final Clinical Impressions(s) / ED Diagnoses   Final diagnoses:  Hypertensive urgency    ED Discharge Orders    None       Landis Martins 05/15/19 2008    Virgel Manifold, MD 05/15/19 2108

## 2019-05-15 NOTE — ED Triage Notes (Signed)
Pt c/o headache today.  Reports bp elevated at dialysis today.  PT received full dialysis treatment but no improvement in bp or headache.  EMS reports bp 260/144 and hr 50s.  REports sinus brady with PVCs on monitor.

## 2019-05-19 ENCOUNTER — Emergency Department (HOSPITAL_COMMUNITY)
Admission: EM | Admit: 2019-05-19 | Discharge: 2019-05-20 | Disposition: A | Discharge status: Home / Self Care | Payer: No Typology Code available for payment source | Attending: Emergency Medicine | Admitting: Emergency Medicine

## 2019-05-19 ENCOUNTER — Other Ambulatory Visit: Payer: Self-pay

## 2019-05-19 ENCOUNTER — Encounter (HOSPITAL_COMMUNITY): Payer: Self-pay | Admitting: Emergency Medicine

## 2019-05-19 DIAGNOSIS — Z992 Dependence on renal dialysis: Secondary | ICD-10-CM | POA: Insufficient documentation

## 2019-05-19 DIAGNOSIS — Z79899 Other long term (current) drug therapy: Secondary | ICD-10-CM | POA: Insufficient documentation

## 2019-05-19 DIAGNOSIS — I120 Hypertensive chronic kidney disease with stage 5 chronic kidney disease or end stage renal disease: Secondary | ICD-10-CM | POA: Insufficient documentation

## 2019-05-19 DIAGNOSIS — N186 End stage renal disease: Secondary | ICD-10-CM | POA: Insufficient documentation

## 2019-05-19 DIAGNOSIS — I10 Essential (primary) hypertension: Secondary | ICD-10-CM

## 2019-05-19 LAB — CBC WITH DIFFERENTIAL/PLATELET
Abs Immature Granulocytes: 0.01 10*3/uL (ref 0.00–0.07)
Basophils Absolute: 0.1 10*3/uL (ref 0.0–0.1)
Basophils Relative: 1 %
Eosinophils Absolute: 0.5 10*3/uL (ref 0.0–0.5)
Eosinophils Relative: 11 %
HCT: 41.4 % (ref 39.0–52.0)
Hemoglobin: 13.6 g/dL (ref 13.0–17.0)
Immature Granulocytes: 0 %
Lymphocytes Relative: 33 %
Lymphs Abs: 1.5 10*3/uL (ref 0.7–4.0)
MCH: 27.2 pg (ref 26.0–34.0)
MCHC: 32.9 g/dL (ref 30.0–36.0)
MCV: 82.8 fL (ref 80.0–100.0)
Monocytes Absolute: 0.5 10*3/uL (ref 0.1–1.0)
Monocytes Relative: 11 %
Neutro Abs: 2 10*3/uL (ref 1.7–7.7)
Neutrophils Relative %: 44 %
Platelets: 221 10*3/uL (ref 150–400)
RBC: 5 MIL/uL (ref 4.22–5.81)
RDW: 13.2 % (ref 11.5–15.5)
WBC: 4.5 10*3/uL (ref 4.0–10.5)
nRBC: 0 % (ref 0.0–0.2)

## 2019-05-19 LAB — COMPREHENSIVE METABOLIC PANEL
ALT: 18 U/L (ref 0–44)
AST: 12 U/L — ABNORMAL LOW (ref 15–41)
Albumin: 3.8 g/dL (ref 3.5–5.0)
Alkaline Phosphatase: 51 U/L (ref 38–126)
Anion gap: 11 (ref 5–15)
BUN: 24 mg/dL — ABNORMAL HIGH (ref 6–20)
CO2: 27 mmol/L (ref 22–32)
Calcium: 8.7 mg/dL — ABNORMAL LOW (ref 8.9–10.3)
Chloride: 96 mmol/L — ABNORMAL LOW (ref 98–111)
Creatinine, Ser: 6.49 mg/dL — ABNORMAL HIGH (ref 0.61–1.24)
GFR calc Af Amer: 10 mL/min — ABNORMAL LOW (ref >60)
GFR calc non Af Amer: 9 mL/min — ABNORMAL LOW (ref >60)
Glucose, Bld: 83 mg/dL (ref 70–99)
Potassium: 3.4 mmol/L — ABNORMAL LOW (ref 3.5–5.1)
Sodium: 134 mmol/L — ABNORMAL LOW (ref 135–145)
Total Bilirubin: 0.6 mg/dL (ref 0.3–1.2)
Total Protein: 7.5 g/dL (ref 6.5–8.1)

## 2019-05-19 NOTE — ED Triage Notes (Signed)
Sent from dialysis for elevated bp, sys was in 200's at dialysis, given 2 clonidine's, bp was still in 200's upon recheck.  Ems reports bp was 148/89.  Pt denies any symptoms.

## 2019-05-20 MED ORDER — CLONIDINE HCL 0.2 MG PO TABS
0.2000 mg | ORAL_TABLET | Freq: Once | ORAL | Status: AC
  Administered 2019-05-20: 0.2 mg via ORAL
  Filled 2019-05-20: qty 1

## 2019-05-20 MED ORDER — CLONIDINE HCL 0.2 MG PO TABS: ORAL_TABLET | ORAL | 0 refills | Status: AC

## 2019-05-20 NOTE — Discharge Instructions (Addendum)
I gave you some extra clonidine tablets to take if your blood pressure is remaining elevated.  You need to discuss your blood pressure medication with your dialysis doctor or your primary care doctor at the [place].  They may want to adjust your every day medications.  Return to the emergency department if you feel worse.

## 2019-05-20 NOTE — ED Provider Notes (Signed)
[facility] EMERGENCY DEPARTMENT Provider Note   CSN: [id number] Arrival date & time: 05/19/19  1618   Time seen 12:46 AM  History   Chief Complaint Chief Complaint  Patient presents with  . Hypertension    HPI Justin Barrett is a 58 y.o. male.     HPI patient states he gets dialysis on Monday, Wednesday, and Friday, earlier today.  He thinks his dialysis doctor is Dr. Lowanda Foster.  He gets dialysis on [address]  He states when he had dialysis done on Monday this week September 7 he had a headache after dialysis and his blood pressure went up.  He states they gave him what sounds like may be clonidine.  He describes the headache as frontal.  He states he felt fine the following several days until Friday, September 11 when he again started having high blood pressure during dialysis.  He was given what sounds like clonidine again.  He denies any pain however today.  He currently denies headache, chest pain, shortness of breath, visual changes, any numbness or tingling of his extremities.  He states he has been on clonidine and states he takes 2 pills in the morning, it is not clear whether he takes 2 of the point to or if he takes 2 of the 0.1.  When I ask him if he supposed to take it twice a day he states he has always taken it 2 pills in the morning.  [facility], [place]   Past Medical History:  Diagnosis Date  . End stage renal failure on dialysis ([place])   . HTN (hypertension)     Patient Active Problem List   Diagnosis Date Noted  . ESRD (end stage renal disease) ([place])   . Fever   . HTN (hypertension) 07/17/2014  . Encounter for screening colonoscopy 07/17/2014    Past Surgical History:  Procedure Laterality Date  . APPENDECTOMY  2010  . AV FISTULA PLACEMENT    . IR FLUORO GUIDE CV LINE LEFT  09/01/2017  . IR US GUIDE VASC ACCESS LEFT  09/01/2017        Home Medications    Prior to Admission medications   Medication Sig Start Date End Date Taking?  Authorizing Provider  cloNIDine (CATAPRES) 0.2 MG tablet Take 2 tablets by mouth daily.  01/04/15   [provider]  cloNIDine (CATAPRES) 0.2 MG tablet Take an extra clonidine for blood pressure that remains elevated 05/20/19   Rolland Porter, MD  lisinopril (PRINIVIL,ZESTRIL) 40 MG tablet Take 20 mg by mouth daily.    [provider]  sevelamer carbonate (RENVELA) 800 MG tablet Take 2,400 mg by mouth 3 (three) times daily with meals.    [provider]  sodium bicarbonate 650 MG tablet Take 1,300 mg by mouth daily.    [provider]    Family History Family History  Problem Relation Age of Onset  . Colon cancer Neg Hx        not known    Social History Social History   Tobacco Use  . Smoking status: Never Smoker  . Smokeless tobacco: Never Used  Substance Use Topics  . Alcohol use: No    Alcohol/week: 0.0 standard drinks  . Drug use: No     Allergies   Patient has no known allergies.   Review of Systems Review of Systems  All other systems reviewed and are negative.    Physical Exam Updated Vital Signs BP (!) 159/93  Pulse (!) 57   Temp 98.1 F (36.7 C) (Oral)   Resp 14   Ht 5\' 11"  (1.803 m)   Wt 72.6 kg   SpO2 96%   BMI 22.32 kg/m   Physical Exam Vitals signs and nursing note reviewed.  Constitutional:      General: He is not in acute distress.    Appearance: Normal appearance. He is well-developed. He is not ill-appearing or toxic-appearing.  HENT:     Head: Normocephalic and atraumatic.     Right Ear: External ear normal.     Left Ear: External ear normal.     Nose: Nose normal. No mucosal edema or rhinorrhea.     Mouth/Throat:     Mouth: Mucous membranes are dry.     Dentition: No dental abscesses.     Pharynx: No uvula swelling.  Eyes:     Extraocular Movements: Extraocular movements intact.     Conjunctiva/sclera: Conjunctivae normal.     Pupils: Pupils are equal, round, and reactive to light.  Neck:      Musculoskeletal: Full passive range of motion without pain, normal range of motion and neck supple.  Cardiovascular:     Rate and Rhythm: Normal rate and regular rhythm.     Heart sounds: Normal heart sounds. No murmur. No friction rub. No gallop.   Pulmonary:     Effort: Pulmonary effort is normal. No respiratory distress.     Breath sounds: Normal breath sounds. No wheezing, rhonchi or rales.  Chest:     Chest wall: No tenderness or crepitus.  Abdominal:     General: Bowel sounds are normal. There is no distension.     Palpations: Abdomen is soft.     Tenderness: There is no abdominal tenderness. There is no guarding or rebound.  Musculoskeletal: Normal range of motion.        General: No tenderness.     Right lower leg: No edema.     Left lower leg: No edema.     Comments: Moves all extremities well.   Skin:    General: Skin is warm and dry.     Coloration: Skin is not pale.     Findings: No erythema or rash.  Neurological:     General: No focal deficit present.     Mental Status: He is alert and oriented to person, place, and time.     Cranial Nerves: No cranial nerve deficit.  Psychiatric:        Mood and Affect: Mood normal. Mood is not anxious.        Speech: Speech normal.        Behavior: Behavior normal.        Thought Content: Thought content normal.      ED Treatments / Results  Labs (all labs ordered are listed, but only abnormal results are displayed) Results for orders placed or performed during the hospital encounter of 05/19/19  CBC with Differential  Result Value Ref Range   WBC 4.5 4.0 - 10.5 K/uL   RBC 5.00 4.22 - 5.81 MIL/uL   Hemoglobin 13.6 13.0 - 17.0 g/dL   HCT 41.4 39.0 - 52.0 %   MCV 82.8 80.0 - 100.0 fL   MCH 27.2 26.0 - 34.0 pg   MCHC 32.9 30.0 - 36.0 g/dL   RDW 13.2 11.5 - 15.5 %   Platelets 221 150 - 400 K/uL   nRBC 0.0 0.0 - 0.2 %   Neutrophils Relative % 44 %  Neutro Abs 2.0 1.7 - 7.7 K/uL   Lymphocytes Relative 33 %   Lymphs Abs  1.5 0.7 - 4.0 K/uL   Monocytes Relative 11 %   Monocytes Absolute 0.5 0.1 - 1.0 K/uL   Eosinophils Relative 11 %   Eosinophils Absolute 0.5 0.0 - 0.5 K/uL   Basophils Relative 1 %   Basophils Absolute 0.1 0.0 - 0.1 K/uL   Immature Granulocytes 0 %   Abs Immature Granulocytes 0.01 0.00 - 0.07 K/uL  Comprehensive metabolic panel  Result Value Ref Range   Sodium 134 (L) 135 - 145 mmol/L   Potassium 3.4 (L) 3.5 - 5.1 mmol/L   Chloride 96 (L) 98 - 111 mmol/L   CO2 27 22 - 32 mmol/L   Glucose, Bld 83 70 - 99 mg/dL   BUN 24 (H) 6 - 20 mg/dL   Creatinine, Ser 6.49 (H) 0.61 - 1.24 mg/dL   Calcium 8.7 (L) 8.9 - 10.3 mg/dL   Total Protein 7.5 6.5 - 8.1 g/dL   Albumin 3.8 3.5 - 5.0 g/dL   AST 12 (L) 15 - 41 U/L   ALT 18 0 - 44 U/L   Alkaline Phosphatase 51 38 - 126 U/L   Total Bilirubin 0.6 0.3 - 1.2 mg/dL   GFR calc non Af Amer 9 (L) >60 mL/min   GFR calc Af Amer 10 (L) >60 mL/min   Anion gap 11 5 - 15   Laboratory interpretation all normal except chronic renal failure, hypokalemia    EKG EKG Interpretation  Date/Time:  Friday May 19 2019 23:01:18 EDT Ventricular Rate:  49 PR Interval:    QRS Duration: 87 QT Interval:  480 QTC Calculation: 434 R Axis:   21 Text Interpretation:  Sinus bradycardia Nonspecific T abnormalities, lateral leads Minimal ST elevation, anterior leads since last tracing no significant change 15 May 2019 Confirmed by Rolland Porter [phone]) on 05/19/2019 11:15:01 PM   Radiology No results found.  Procedures Procedures (including critical care time)  Medications Ordered in ED Medications  cloNIDine (CATAPRES) tablet 0.2 mg (0.2 mg Oral Given 05/20/19 0057)     Initial Impression / Assessment and Plan / ED Course  I have reviewed the triage vital signs and the nursing notes.  Pertinent labs & imaging results that were available during my care of the patient were reviewed by me and considered in my medical decision making (see chart for details).    Patient's initial blood pressure in triage was 142/92.  As he remained in the ED it kept gradually increasing until he got up to 215/117 at midnight.  At 1 AM his blood pressure was 185/116 and he was given clonidine 0.2 mg orally.  We discussed that clonidine has to be taken on a regular basis and he cannot stop it "cold [place]".  I discussed with him that I would give him some extra clonidine to take if his blood pressure got high that he could self treat himself.   2 AM blood pressure is 159/93.  He was discharged home.  He states he feels fine.  Final Clinical Impressions(s) / ED Diagnoses   Final diagnoses:  Essential hypertension    ED Discharge Orders         Ordered    cloNIDine (CATAPRES) 0.2 MG tablet     05/20/19 0208         Plan discharge  Rolland Porter, MD, Barbette Or, MD 05/20/19 [phone]

## 2019-06-05 ENCOUNTER — Other Ambulatory Visit: Payer: Self-pay

## 2019-06-05 ENCOUNTER — Encounter (HOSPITAL_COMMUNITY): Payer: Self-pay

## 2019-06-05 ENCOUNTER — Emergency Department (HOSPITAL_COMMUNITY)
Admission: EM | Admit: 2019-06-05 | Discharge: 2019-06-05 | Disposition: A | Discharge status: Home / Self Care | Payer: No Typology Code available for payment source | Attending: Emergency Medicine | Admitting: Emergency Medicine

## 2019-06-05 DIAGNOSIS — Z992 Dependence on renal dialysis: Secondary | ICD-10-CM | POA: Insufficient documentation

## 2019-06-05 DIAGNOSIS — Z79899 Other long term (current) drug therapy: Secondary | ICD-10-CM | POA: Insufficient documentation

## 2019-06-05 DIAGNOSIS — I120 Hypertensive chronic kidney disease with stage 5 chronic kidney disease or end stage renal disease: Secondary | ICD-10-CM | POA: Diagnosis not present

## 2019-06-05 DIAGNOSIS — N186 End stage renal disease: Secondary | ICD-10-CM | POA: Diagnosis not present

## 2019-06-05 DIAGNOSIS — I10 Essential (primary) hypertension: Secondary | ICD-10-CM

## 2019-06-05 LAB — CBC WITH DIFFERENTIAL/PLATELET
Abs Immature Granulocytes: 0.01 10*3/uL (ref 0.00–0.07)
Basophils Absolute: 0 10*3/uL (ref 0.0–0.1)
Basophils Relative: 1 %
Eosinophils Absolute: 0.4 10*3/uL (ref 0.0–0.5)
Eosinophils Relative: 8 %
HCT: 43.7 % (ref 39.0–52.0)
Hemoglobin: 14.4 g/dL (ref 13.0–17.0)
Immature Granulocytes: 0 %
Lymphocytes Relative: 29 %
Lymphs Abs: 1.4 10*3/uL (ref 0.7–4.0)
MCH: 27.2 pg (ref 26.0–34.0)
MCHC: 33 g/dL (ref 30.0–36.0)
MCV: 82.5 fL (ref 80.0–100.0)
Monocytes Absolute: 0.5 10*3/uL (ref 0.1–1.0)
Monocytes Relative: 9 %
Neutro Abs: 2.5 10*3/uL (ref 1.7–7.7)
Neutrophils Relative %: 53 %
Platelets: 227 10*3/uL (ref 150–400)
RBC: 5.3 MIL/uL (ref 4.22–5.81)
RDW: 13.1 % (ref 11.5–15.5)
WBC: 4.8 10*3/uL (ref 4.0–10.5)
nRBC: 0 % (ref 0.0–0.2)

## 2019-06-05 LAB — BASIC METABOLIC PANEL
Anion gap: 12 (ref 5–15)
BUN: 20 mg/dL (ref 6–20)
CO2: 33 mmol/L — ABNORMAL HIGH (ref 22–32)
Calcium: 8.9 mg/dL (ref 8.9–10.3)
Chloride: 95 mmol/L — ABNORMAL LOW (ref 98–111)
Creatinine, Ser: 7.25 mg/dL — ABNORMAL HIGH (ref 0.61–1.24)
GFR calc Af Amer: 9 mL/min — ABNORMAL LOW (ref >60)
GFR calc non Af Amer: 8 mL/min — ABNORMAL LOW (ref >60)
Glucose, Bld: 132 mg/dL — ABNORMAL HIGH (ref 70–99)
Potassium: 3.5 mmol/L (ref 3.5–5.1)
Sodium: 140 mmol/L (ref 135–145)

## 2019-06-05 MED ORDER — AMLODIPINE BESYLATE 5 MG PO TABS
10.0000 mg | ORAL_TABLET | Freq: Once | ORAL | Status: AC
  Administered 2019-06-05: 19:00:00 10 mg via ORAL
  Filled 2019-06-05: qty 2

## 2019-06-05 MED ORDER — CLONIDINE HCL 0.2 MG PO TABS
0.2000 mg | ORAL_TABLET | Freq: Once | ORAL | Status: AC
  Administered 2019-06-05: 0.2 mg via ORAL
  Filled 2019-06-05: qty 1

## 2019-06-05 NOTE — Discharge Instructions (Addendum)
Please speak with your physicians concerning your blood pressure control.  Please monitor your diet and your intake carefully.  Please return to the emergency department if there are any worsening of your symptoms, changes in your condition, problems or concerns.

## 2019-06-05 NOTE — ED Provider Notes (Signed)
[facility] EMERGENCY DEPARTMENT Provider Note   CSN: [id number] Arrival date & time: 06/05/19  1505     History   Chief Complaint Chief Complaint  Patient presents with  . Hypertension    HPI Justin Barrett is a 58 y.o. male.     Patient is a 57 year old male who presents to the emergency department by EMS because of elevation in his blood pressure.  The patient has end-stage renal disease, and is on dialysis on Monday Wednesday and Friday.  He had dialysis today and was sent from his dialysis station to the emergency department because his systolic blood pressure was in the 200s.  The patient states that he received clonidine 0.2 mg orally around 2:00 today.  He says that he is on a clonidine patch as well.  He denies any changes in vision, balance, strength, walking, swallowing, or speaking.  He has had an occasional headache from time to time.  There is been no nausea, or vomiting.  No recent illness that he is aware of.  The patient states he has been taking his medications as ordered.  He presents now for additional management of this blood pressure issue.  The history is provided by the patient.  Hypertension Associated symptoms include headaches. Pertinent negatives include no chest pain, no abdominal pain and no shortness of breath.    Past Medical History:  Diagnosis Date  . End stage renal failure on dialysis ([place])   . HTN (hypertension)     Patient Active Problem List   Diagnosis Date Noted  . ESRD (end stage renal disease) ([place])   . Fever   . HTN (hypertension) 07/17/2014  . Encounter for screening colonoscopy 07/17/2014    Past Surgical History:  Procedure Laterality Date  . APPENDECTOMY  2010  . AV FISTULA PLACEMENT    . IR FLUORO GUIDE CV LINE LEFT  09/01/2017  . IR US GUIDE VASC ACCESS LEFT  09/01/2017        Home Medications    Prior to Admission medications   Medication Sig Start Date End Date Taking? Authorizing Provider  cloNIDine  (CATAPRES) 0.2 MG tablet Take 2 tablets by mouth daily.  01/04/15   [provider]  cloNIDine (CATAPRES) 0.2 MG tablet Take an extra clonidine for blood pressure that remains elevated 05/20/19   Rolland Porter, MD  lisinopril (PRINIVIL,ZESTRIL) 40 MG tablet Take 20 mg by mouth daily.    [provider]  sevelamer carbonate (RENVELA) 800 MG tablet Take 2,400 mg by mouth 3 (three) times daily with meals.    [provider]  sodium bicarbonate 650 MG tablet Take 1,300 mg by mouth daily.    [provider]    Family History Family History  Problem Relation Age of Onset  . Colon cancer Neg Hx        not known    Social History Social History   Tobacco Use  . Smoking status: Never Smoker  . Smokeless tobacco: Never Used  Substance Use Topics  . Alcohol use: No    Alcohol/week: 0.0 standard drinks  . Drug use: No     Allergies   Patient has no known allergies.   Review of Systems Review of Systems  Constitutional: Negative for activity change and appetite change.  HENT: Negative for congestion, ear discharge, ear pain, facial swelling, nosebleeds, rhinorrhea, sneezing and tinnitus.   Eyes: Negative for photophobia, pain and discharge.  Respiratory: Negative for cough, choking, shortness of breath and wheezing.  Cardiovascular: Negative for chest pain, palpitations and leg swelling.  Gastrointestinal: Negative for abdominal pain, blood in stool, constipation, diarrhea, nausea and vomiting.  Genitourinary: Negative for difficulty urinating, dysuria, flank pain, frequency and hematuria.  Musculoskeletal: Negative for back pain, gait problem, myalgias and neck pain.  Skin: Negative for color change, rash and wound.  Neurological: Positive for headaches. Negative for dizziness, seizures, syncope, facial asymmetry, speech difficulty, weakness and numbness.  Hematological: Negative for adenopathy. Does not bruise/bleed easily.  Psychiatric/Behavioral:  Negative for agitation, confusion, hallucinations, self-injury and suicidal ideas. The patient is not nervous/anxious.      Physical Exam Updated Vital Signs BP (!) 160/120 (BP Location: Right Arm)   Pulse 70   Temp 98.1 F (36.7 C) (Oral)   Resp 18   Ht 5\' 11"  (1.803 m)   Wt 77.1 kg   SpO2 98%   BMI 23.71 kg/m   Physical Exam Vitals signs and nursing note reviewed.  Constitutional:      General: He is not in acute distress.    Appearance: He is well-developed.  HENT:     Head: Normocephalic and atraumatic.     Right Ear: External ear normal.     Left Ear: External ear normal.  Eyes:     General: No scleral icterus.       Right eye: No discharge.        Left eye: No discharge.     Conjunctiva/sclera: Conjunctivae normal.  Neck:     Musculoskeletal: Neck supple.     Trachea: No tracheal deviation.  Cardiovascular:     Rate and Rhythm: Normal rate and regular rhythm.  Pulmonary:     Effort: Pulmonary effort is normal. No respiratory distress.     Breath sounds: Normal breath sounds. No stridor. No wheezing or rales.  Abdominal:     General: Bowel sounds are normal. There is no distension.     Palpations: Abdomen is soft.     Tenderness: There is no abdominal tenderness. There is no guarding or rebound.  Musculoskeletal:        General: No tenderness.     Comments: Patient has a dialysis access catheter in the left upper arm.  Patient has a good thrill present.  Skin:    General: Skin is warm and dry.     Findings: No rash.  Neurological:     Mental Status: He is alert.     Cranial Nerves: No cranial nerve deficit (no facial droop, extraocular movements intact, no slurred speech).     Sensory: No sensory deficit.     Motor: No abnormal muscle tone or seizure activity.     Coordination: Coordination normal.      ED Treatments / Results  Labs (all labs ordered are listed, but only abnormal results are displayed) Labs Reviewed  CBC WITH DIFFERENTIAL/PLATELET   BASIC METABOLIC PANEL    EKG None  Radiology No results found.  Procedures Procedures (including critical care time)  Medications Ordered in ED Medications - No data to display   Initial Impression / Assessment and Plan / ED Course  I have reviewed the triage vital signs and the nursing notes.  Pertinent labs & imaging results that were available during my care of the patient were reviewed by me and considered in my medical decision making (see chart for details).          Final Clinical Impressions(s) / ED Diagnoses MDM  Blood pressure is 160/120, otherwise vital signs within normal limits.  Pulse oximetry is 98% on room air.  Within normal limits by my interpretation.  No gross neurologic deficits appreciated on examination this time.  Patient will be treated with amlodipine in an attempt to gain better control over the blood pressure.  Electrocardiogram is nonacute.  Complete blood count and basic metabolic panel are nonacute.  Recheck of blood pressure shows the blood pressure to be 185/112 after the amlodipine.  Will use clonidine 0.2 mg.  Blood pressure improved from 99991111 systolic to [id number].  I have asked the patient to discuss the difficulty with control of his blood pressure with his primary physician and his nephrologist.  The patient has been seen here in the emergency department on multiple occasions for the same issue.  There is no gross neurologic deficits appreciated at the time of discharge.  Patient will continue his current medications.  He will see his primary physician or return to the emergency department if there are any changes in his condition, worsening of his blood pressure, problems, or concerns.   Final diagnoses:  Essential hypertension  End stage renal disease [facility])    ED Discharge Orders    None       Lily Kocher, PA-C 06/05/19 2158    Milton Ferguson, MD 06/07/19 1008

## 2019-06-05 NOTE — ED Triage Notes (Signed)
Pt sent by [facility] from dialysis for elevated BP, systolic Bp 123456 at dialysis, received clonidine 0.2 mg at 1405.

## 2019-11-05 ENCOUNTER — Ambulatory Visit: Payer: Non-veteran care | Attending: Internal Medicine

## 2019-11-05 DIAGNOSIS — Z23 Encounter for immunization: Secondary | ICD-10-CM | POA: Insufficient documentation

## 2019-11-05 NOTE — Progress Notes (Signed)
   Covid-19 Vaccination Clinic  Name:  PERMAN YAHOLA    MRN: [id number] DOB: 1960-12-31  11/05/2019  Mr. Roome was observed post Covid-19 immunization for 15 minutes without incidence. He was provided with Vaccine Information Sheet and instruction to access the V-Safe system.   Mr. Borello was instructed to call 911 with any severe reactions post vaccine: Marland Kitchen Difficulty breathing  . Swelling of your face and throat  . A fast heartbeat  . A bad rash all over your body  . Dizziness and weakness    Immunizations Administered    Name Date Dose VIS Date Route   Moderna COVID-19 Vaccine 11/05/2019  3:56 PM 0.5 mL 08/08/2019 Intramuscular   Manufacturer: Moderna   Lot: [id number]   [id number]

## 2019-12-09 ENCOUNTER — Ambulatory Visit: Payer: Non-veteran care | Attending: Internal Medicine

## 2019-12-09 DIAGNOSIS — Z23 Encounter for immunization: Secondary | ICD-10-CM

## 2019-12-09 NOTE — Progress Notes (Signed)
   Covid-19 Vaccination Clinic  Name:  TERRIS GERMANO    MRN: [id number] DOB: [birth year]  12/09/2019  Mr. Holquin was observed post Covid-19 immunization for 15 minutes without incident. He was provided with Vaccine Information Sheet and instruction to access the V-Safe system.   Mr. Ladouceur was instructed to call 911 with any severe reactions post vaccine: Marland Kitchen Difficulty breathing  . Swelling of face and throat  . A fast heartbeat  . A bad rash all over body  . Dizziness and weakness   Immunizations Administered    Name Date Dose VIS Date Route   Moderna COVID-19 Vaccine 12/09/2019 11:04 AM 0.5 mL 08/08/2019 Intramuscular   Manufacturer: Moderna   Lot: [id number]   [place]: [phone]

## 2020-06-21 ENCOUNTER — Other Ambulatory Visit (HOSPITAL_COMMUNITY): Payer: Self-pay | Admitting: Student in an Organized Health Care Education/Training Program

## 2020-06-21 DIAGNOSIS — N186 End stage renal disease: Secondary | ICD-10-CM

## 2020-06-27 ENCOUNTER — Other Ambulatory Visit (HOSPITAL_COMMUNITY): Payer: Self-pay | Admitting: Student in an Organized Health Care Education/Training Program

## 2020-06-27 ENCOUNTER — Other Ambulatory Visit: Payer: Self-pay

## 2020-06-27 ENCOUNTER — Ambulatory Visit (HOSPITAL_COMMUNITY)
Admission: RE | Admit: 2020-06-27 | Discharge: 2020-06-27 | Disposition: A | Discharge status: Home / Self Care | Payer: No Typology Code available for payment source | Source: Ambulatory Visit | Attending: Family Medicine | Admitting: Family Medicine

## 2020-06-27 DIAGNOSIS — I5030 Unspecified diastolic (congestive) heart failure: Secondary | ICD-10-CM | POA: Diagnosis not present

## 2020-06-27 DIAGNOSIS — N186 End stage renal disease: Secondary | ICD-10-CM | POA: Diagnosis not present

## 2020-06-27 LAB — ECHOCARDIOGRAM COMPLETE
AR max vel: 1.67 cm2
AV Area VTI: 2.05 cm2
AV Area mean vel: 1.72 cm2
AV Mean grad: 7.4 mmHg
AV Peak grad: 15.1 mmHg
Ao pk vel: 1.94 m/s
Area-P 1/2: 2.07 cm2
S' Lateral: 2.5 cm

## 2020-06-27 NOTE — Progress Notes (Signed)
*  PRELIMINARY RESULTS* Echocardiogram 2D Echocardiogram has been performed.  Justin Barrett 06/27/2020, 10:25 AM

## 2020-12-13 ENCOUNTER — Other Ambulatory Visit: Payer: Self-pay

## 2020-12-13 ENCOUNTER — Emergency Department (HOSPITAL_COMMUNITY): Payer: No Typology Code available for payment source

## 2020-12-13 ENCOUNTER — Emergency Department (HOSPITAL_COMMUNITY)
Admission: EM | Admit: 2020-12-13 | Discharge: 2020-12-14 | Disposition: A | Discharge status: Home / Self Care | Payer: No Typology Code available for payment source | Attending: Emergency Medicine | Admitting: Emergency Medicine

## 2020-12-13 ENCOUNTER — Encounter (HOSPITAL_COMMUNITY): Payer: Self-pay | Admitting: Emergency Medicine

## 2020-12-13 DIAGNOSIS — Z20822 Contact with and (suspected) exposure to covid-19: Secondary | ICD-10-CM | POA: Diagnosis not present

## 2020-12-13 DIAGNOSIS — Z992 Dependence on renal dialysis: Secondary | ICD-10-CM | POA: Insufficient documentation

## 2020-12-13 DIAGNOSIS — I120 Hypertensive chronic kidney disease with stage 5 chronic kidney disease or end stage renal disease: Secondary | ICD-10-CM | POA: Insufficient documentation

## 2020-12-13 DIAGNOSIS — H532 Diplopia: Secondary | ICD-10-CM | POA: Insufficient documentation

## 2020-12-13 DIAGNOSIS — N186 End stage renal disease: Secondary | ICD-10-CM | POA: Diagnosis not present

## 2020-12-13 DIAGNOSIS — H539 Unspecified visual disturbance: Secondary | ICD-10-CM

## 2020-12-13 LAB — CBC WITH DIFFERENTIAL/PLATELET
Abs Immature Granulocytes: 0.01 10*3/uL (ref 0.00–0.07)
Basophils Absolute: 0.1 10*3/uL (ref 0.0–0.1)
Basophils Relative: 1 %
Eosinophils Absolute: 0.3 10*3/uL (ref 0.0–0.5)
Eosinophils Relative: 7 %
HCT: 36.7 % — ABNORMAL LOW (ref 39.0–52.0)
Hemoglobin: 12 g/dL — ABNORMAL LOW (ref 13.0–17.0)
Immature Granulocytes: 0 %
Lymphocytes Relative: 32 %
Lymphs Abs: 1.4 10*3/uL (ref 0.7–4.0)
MCH: 27.4 pg (ref 26.0–34.0)
MCHC: 32.7 g/dL (ref 30.0–36.0)
MCV: 83.8 fL (ref 80.0–100.0)
Monocytes Absolute: 0.4 10*3/uL (ref 0.1–1.0)
Monocytes Relative: 9 %
Neutro Abs: 2.1 10*3/uL (ref 1.7–7.7)
Neutrophils Relative %: 51 %
Platelets: 233 10*3/uL (ref 150–400)
RBC: 4.38 MIL/uL (ref 4.22–5.81)
RDW: 13.5 % (ref 11.5–15.5)
WBC: 4.2 10*3/uL (ref 4.0–10.5)
nRBC: 0 % (ref 0.0–0.2)

## 2020-12-13 LAB — COMPREHENSIVE METABOLIC PANEL
ALT: 13 U/L (ref 0–44)
AST: 12 U/L — ABNORMAL LOW (ref 15–41)
Albumin: 3.8 g/dL (ref 3.5–5.0)
Alkaline Phosphatase: 92 U/L (ref 38–126)
Anion gap: 14 (ref 5–15)
BUN: 20 mg/dL (ref 6–20)
CO2: 27 mmol/L (ref 22–32)
Calcium: 8.1 mg/dL — ABNORMAL LOW (ref 8.9–10.3)
Chloride: 95 mmol/L — ABNORMAL LOW (ref 98–111)
Creatinine, Ser: 5.51 mg/dL — ABNORMAL HIGH (ref 0.61–1.24)
GFR, Estimated: 11 mL/min — ABNORMAL LOW (ref >60)
Glucose, Bld: 83 mg/dL (ref 70–99)
Potassium: 3.5 mmol/L (ref 3.5–5.1)
Sodium: 136 mmol/L (ref 135–145)
Total Bilirubin: 0.7 mg/dL (ref 0.3–1.2)
Total Protein: 7.2 g/dL (ref 6.5–8.1)

## 2020-12-13 NOTE — ED Provider Notes (Signed)
[facility] EMERGENCY DEPARTMENT Provider Note   CSN: [id number] Arrival date & time: 12/13/20  1613     History Chief Complaint  Patient presents with  . Eye Problem    Justin Barrett is a 60 y.o. male.  HPI    60 year old male with history of ESRD on HD and hypertension comes in a chief complaint of eye problem.  Patient reports that he woke up this morning around 645 and noted diplopia.  He has horizontal diplopia that is worse with sides and distance.  No associated eye pain.  He had diplopia several years ago that self resolved.  He denies any associated numbness, tingling, weakness, slurred speech.  Past Medical History:  Diagnosis Date  . End stage renal failure on dialysis ([place])   . HTN (hypertension)     Patient Active Problem List   Diagnosis Date Noted  . ESRD (end stage renal disease) ([place])   . Fever   . HTN (hypertension) 07/17/2014  . Encounter for screening colonoscopy 07/17/2014    Past Surgical History:  Procedure Laterality Date  . APPENDECTOMY  2010  . AV FISTULA PLACEMENT    . IR FLUORO GUIDE CV LINE LEFT  09/01/2017  . IR US GUIDE VASC ACCESS LEFT  09/01/2017       Family History  Problem Relation Age of Onset  . Colon cancer Neg Hx        not known    Social History   Tobacco Use  . Smoking status: Never Smoker  . Smokeless tobacco: Never Used  Vaping Use  . Vaping Use: Never used  Substance Use Topics  . Alcohol use: No    Alcohol/week: 0.0 standard drinks  . Drug use: No    Home Medications Prior to Admission medications   Medication Sig Start Date End Date Taking? Authorizing Provider  cloNIDine (CATAPRES - DOSED IN MG/24 HR) 0.2 mg/24hr patch Place 0.2 mg onto the skin once a week. 11/13/20  Yes [provider]  lisinopril (PRINIVIL,ZESTRIL) 40 MG tablet Take 20 mg by mouth daily.   Yes [provider]  cloNIDine (CATAPRES) 0.2 MG tablet Take 2 tablets by mouth daily.  Patient not taking: Reported on  12/13/2020 01/04/15   [provider]  cloNIDine (CATAPRES) 0.2 MG tablet Take an extra clonidine for blood pressure that remains elevated Patient not taking: No sig reported 05/20/19   Rolland Porter, MD  sevelamer carbonate (RENVELA) 800 MG tablet Take 2,400 mg by mouth 3 (three) times daily with meals. Patient not taking: Reported on 12/13/2020    [provider]  sodium bicarbonate 650 MG tablet Take 1,300 mg by mouth daily. Patient not taking: Reported on 12/13/2020    [provider]    Allergies    Patient has no known allergies.  Review of Systems   Review of Systems  Constitutional: Positive for activity change.  Eyes: Positive for visual disturbance. Negative for pain.  Respiratory: Negative for shortness of breath.   Cardiovascular: Negative for chest pain.  Gastrointestinal: Negative for nausea and vomiting.  Neurological: Negative for dizziness, seizures, speech difficulty, weakness, light-headedness, numbness and headaches.  All other systems reviewed and are negative.   Physical Exam Updated Vital Signs BP 109/73   Pulse 60   Temp 97.8 F (36.6 C) (Oral)   Resp 16   Ht '5\' 11"'$  (1.803 m)   Wt 77 kg   SpO2 99%   BMI 23.68 kg/m   Physical Exam Vitals and  nursing note reviewed.  Constitutional:      Appearance: He is well-developed.  HENT:     Head: Atraumatic.  Eyes:     Extraocular Movements: Extraocular movements intact.     Pupils: Pupils are equal, round, and reactive to light.     Comments: Positive diplopia, binocular.  Diplopia resolves with covering of one of the eye.  Peripheral visual fields are normal  Cardiovascular:     Rate and Rhythm: Normal rate.  Pulmonary:     Effort: Pulmonary effort is normal.  Musculoskeletal:     Cervical back: Neck supple.  Skin:    General: Skin is warm.  Neurological:     Mental Status: He is alert and oriented to person, place, and time.     Cranial Nerves: Cranial nerve deficit present.      Sensory: No sensory deficit.     Motor: No weakness.     Coordination: Coordination normal.     ED Results / Procedures / Treatments   Labs (all labs ordered are listed, but only abnormal results are displayed) Labs Reviewed  CBC WITH DIFFERENTIAL/PLATELET - Abnormal; Notable for the following components:      Result Value   Hemoglobin 12.0 (*)    HCT 36.7 (*)    All other components within normal limits  COMPREHENSIVE METABOLIC PANEL - Abnormal; Notable for the following components:   Chloride 95 (*)    Creatinine, Ser 5.51 (*)    Calcium 8.1 (*)    AST 12 (*)    GFR, Estimated 11 (*)    All other components within normal limits  RESP PANEL BY RT-PCR (FLU A&B, COVID) ARPGX2    EKG None  Radiology No results found.  Procedures Procedures   Medications Ordered in ED Medications - No data to display  ED Course  I have reviewed the triage vital signs and the nursing notes.  Pertinent labs & imaging results that were available during my care of the patient were reviewed by me and considered in my medical decision making (see chart for details).  Clinical Course as of 12/16/20 1511  Fri Dec 13, 2020  2328 Neurology recommends MRI with and without contrast of the brain to rule out any demyelination or ischemic stroke. [AN]    Clinical Course User Index [AN] Varney Biles, MD   MDM Rules/Calculators/A&P                          60 year old male with history of ESRD on HD comes in with chief complaint of binocular diplopia.  Patient's diplopia resolves when he covers either of his eye.  There is no eye pain.  No clear evidence of nerve palsy.  Differential diagnosis would include things like MS and also stroke or other central pathology.  Discussed case with neurology.  Teleneuro recommends that patient get MRI with and without contrast, can be discharged with neuro follow-up if negative. Transfer to MRI.  Final Clinical Impression(s) / ED Diagnoses Final  diagnoses:  Transient visual disturbance    Rx / DC Orders ED Discharge Orders    None       Varney Biles, MD 12/16/20 1512

## 2020-12-13 NOTE — ED Notes (Signed)
Tele neuro assessment completed

## 2020-12-13 NOTE — ED Triage Notes (Signed)
Pt states waking up at 0645 this morning with change in vision. Pt states its double vision. Went to dialysis and completed.

## 2020-12-14 ENCOUNTER — Emergency Department (HOSPITAL_COMMUNITY): Payer: No Typology Code available for payment source

## 2020-12-14 DIAGNOSIS — I120 Hypertensive chronic kidney disease with stage 5 chronic kidney disease or end stage renal disease: Secondary | ICD-10-CM | POA: Diagnosis not present

## 2020-12-14 DIAGNOSIS — Z992 Dependence on renal dialysis: Secondary | ICD-10-CM | POA: Diagnosis not present

## 2020-12-14 DIAGNOSIS — Z20822 Contact with and (suspected) exposure to covid-19: Secondary | ICD-10-CM | POA: Diagnosis not present

## 2020-12-14 DIAGNOSIS — N186 End stage renal disease: Secondary | ICD-10-CM | POA: Diagnosis not present

## 2020-12-14 DIAGNOSIS — H532 Diplopia: Secondary | ICD-10-CM | POA: Diagnosis present

## 2020-12-14 LAB — RESP PANEL BY RT-PCR (FLU A&B, COVID) ARPGX2
Influenza A by PCR: NEGATIVE
Influenza B by PCR: NEGATIVE
SARS Coronavirus 2 by RT PCR: NEGATIVE

## 2020-12-14 NOTE — ED Notes (Signed)
Pt to MRI

## 2020-12-14 NOTE — ED Provider Notes (Signed)
Signout note  60 year old male transferred after he woke up with double vision.  Discussed case with Dr. Kathrynn Humble, requests MRI.  If MRI negative, anticipate discharge home and outpatient ophthalmology.  MRI negative, reassessed patient, he states that his double vision has resolved and has no ongoing complaints, will discharge home and recommend outpatient ophthalmology follow-up and primary care follow-up     Lucrezia Starch, MD 12/14/20 [phone]

## 2020-12-14 NOTE — Consult Note (Signed)
[facility] TeleNeurology Consult Services  Stat Consult  Date of Service:   12/13/2020 20:42:58  Diagnosis:     .  H53.2 - Diplopia  Impression: Justin Barrett is a 59 yo M w/ a hx of HTN who presents with vertical diplopia. Here, there were no focal deficits on exam. Head CT showed no acute intracranial pathology. Ddx includes posterior circulation stroke vs ischemic cranial neuropathy. Out of the window for tPA. Recommend the following:  - ASA 325 mg once  - MRI brain w/wo contrast  - PT/OT  - Neurology to follow  CT HEAD: Showed No Acute Hemorrhage or Acute Core Infarct  Our recommendations are outlined below.  Diagnostic Studies: MRI brain w/wo contrast  Disposition: Neurology will follow   Imaging: Head CT: No acute intracranial pathology  Metrics: [facility] Notification Time: 12/13/2020 20:41:10 Stamp Time: 12/13/2020 20:42:58 Callback Response Time: 12/13/2020 20:49:23   ----------------------------------------------------------------------------------------------------  Chief Complaint: diplopia  History of Present Illness: Patient is a 60 year old Male.  Justin Barrett is a 60 yo M w/ a hx of HTN who presents with vertical diplopia. LKN at 2230 on 4/8. Woke up this morning with vertical diplopia. Denies any other symptoms. Here, there were no focal deficits on exam. Head CT showed no acute intracranial pathology.   Past Medical History:     . Hypertension  Anticoagulant use:  No  Antiplatelet use: No   Examination: BP(156/98), Pulse(59), Blood Glucose(132) 1A: Level of Consciousness - Alert; keenly responsive + 0 1B: Ask Month and Age - Both Questions Right + 0 1C: Blink Eyes & Squeeze Hands - Performs Both Tasks + 0 2: Test Horizontal Extraocular Movements - Normal + 0 3: Test Visual Fields - No Visual Loss + 0 4: Test Facial Palsy (Use Grimace if Obtunded) - Normal symmetry + 0 5A: Test Left Arm Motor Drift - No Drift for 10 Seconds +  0 5B: Test Right Arm Motor Drift - No Drift for 10 Seconds + 0 6A: Test Left Leg Motor Drift - No Drift for 5 Seconds + 0 6B: Test Right Leg Motor Drift - No Drift for 5 Seconds + 0 7: Test Limb Ataxia (FNF/Heel-Shin) - No Ataxia + 0 8: Test Sensation - Normal; No sensory loss + 0 9: Test Language/Aphasia - Normal; No aphasia + 0 10: Test Dysarthria - Normal + 0 11: Test Extinction/Inattention - No abnormality + 0  NIHSS Score: 0     Patient / Family was informed the Neurology Consult would occur via TeleHealth consult by way of interactive audio and video telecommunications and consented to receiving care in this manner.  Patient is being evaluated for possible acute neurologic impairment and high probability of imminent or life - threatening deterioration.I spent total of 20 minutes providing care to this patient, including time for face to face visit via telemedicine, review of medical records, imaging studies and discussion of findings with providers, the patient and / or family.   Dr Abbe Amsterdam   [facility] [phone]  Case [id number]

## 2020-12-14 NOTE — ED Notes (Signed)
Pt arrived to [facility] via [place] from [facility]. Pt has dialysis M, W, F. On Friday (yesterday) am, pt woke up with blurred/double vision. Pt went to dialysis because he has had symptoms similar to these before and they went away. Pt had full course of dialysis and was sent to ED from dialysis. Pt transferred for MRI. Pt alert and oriented, has minimal amount of blurred vision, no complaints on arrival, NAD noted.

## 2020-12-14 NOTE — Discharge Instructions (Signed)
Please call the ophthalmology office to get an appointment as soon as possible.  If your vision in any way worsens, you develop other new concerning symptoms such as chest pain, numbness, weakness, return to ER for reassessment.  Additionally would recommend follow-up with your primary doctor.

## 2020-12-14 NOTE — ED Notes (Signed)
Report given to [facility]. 

## 2020-12-14 NOTE — ED Notes (Signed)
Pt in MRI.

## 2020-12-14 NOTE — ED Notes (Signed)
Pt discharged and wheeled out of the ED in a wheel chair without difficulty. 

## 2022-02-04 ENCOUNTER — Emergency Department (HOSPITAL_COMMUNITY): Payer: No Typology Code available for payment source

## 2022-02-04 ENCOUNTER — Other Ambulatory Visit: Payer: Self-pay

## 2022-02-04 ENCOUNTER — Emergency Department (HOSPITAL_COMMUNITY)
Admission: EM | Admit: 2022-02-04 | Discharge: 2022-02-04 | Disposition: A | Discharge status: Home / Self Care | Payer: No Typology Code available for payment source | Attending: Emergency Medicine | Admitting: Emergency Medicine

## 2022-02-04 DIAGNOSIS — I120 Hypertensive chronic kidney disease with stage 5 chronic kidney disease or end stage renal disease: Secondary | ICD-10-CM | POA: Insufficient documentation

## 2022-02-04 DIAGNOSIS — Z992 Dependence on renal dialysis: Secondary | ICD-10-CM | POA: Insufficient documentation

## 2022-02-04 DIAGNOSIS — Z20822 Contact with and (suspected) exposure to covid-19: Secondary | ICD-10-CM | POA: Diagnosis not present

## 2022-02-04 DIAGNOSIS — D649 Anemia, unspecified: Secondary | ICD-10-CM | POA: Diagnosis not present

## 2022-02-04 DIAGNOSIS — Z79899 Other long term (current) drug therapy: Secondary | ICD-10-CM | POA: Insufficient documentation

## 2022-02-04 DIAGNOSIS — R059 Cough, unspecified: Secondary | ICD-10-CM | POA: Diagnosis present

## 2022-02-04 DIAGNOSIS — N186 End stage renal disease: Secondary | ICD-10-CM | POA: Diagnosis not present

## 2022-02-04 DIAGNOSIS — J069 Acute upper respiratory infection, unspecified: Secondary | ICD-10-CM

## 2022-02-04 DIAGNOSIS — I10 Essential (primary) hypertension: Secondary | ICD-10-CM

## 2022-02-04 LAB — RESP PANEL BY RT-PCR (FLU A&B, COVID) ARPGX2
Influenza A by PCR: NEGATIVE
Influenza B by PCR: NEGATIVE
SARS Coronavirus 2 by RT PCR: NEGATIVE

## 2022-02-04 LAB — CBC
HCT: 35.1 % — ABNORMAL LOW (ref 39.0–52.0)
Hemoglobin: 11.8 g/dL — ABNORMAL LOW (ref 13.0–17.0)
MCH: 27.4 pg (ref 26.0–34.0)
MCHC: 33.6 g/dL (ref 30.0–36.0)
MCV: 81.4 fL (ref 80.0–100.0)
Platelets: 241 10*3/uL (ref 150–400)
RBC: 4.31 MIL/uL (ref 4.22–5.81)
RDW: 14 % (ref 11.5–15.5)
WBC: 6.5 10*3/uL (ref 4.0–10.5)
nRBC: 0 % (ref 0.0–0.2)

## 2022-02-04 LAB — COMPREHENSIVE METABOLIC PANEL
ALT: 13 U/L (ref 0–44)
AST: 13 U/L — ABNORMAL LOW (ref 15–41)
Albumin: 4.1 g/dL (ref 3.5–5.0)
Alkaline Phosphatase: 55 U/L (ref 38–126)
Anion gap: 9 (ref 5–15)
BUN: 13 mg/dL (ref 6–20)
CO2: 30 mmol/L (ref 22–32)
Calcium: 8.8 mg/dL — ABNORMAL LOW (ref 8.9–10.3)
Chloride: 102 mmol/L (ref 98–111)
Creatinine, Ser: 5.13 mg/dL — ABNORMAL HIGH (ref 0.61–1.24)
GFR, Estimated: 12 mL/min — ABNORMAL LOW (ref >60)
Glucose, Bld: 89 mg/dL (ref 70–99)
Potassium: 3.9 mmol/L (ref 3.5–5.1)
Sodium: 141 mmol/L (ref 135–145)
Total Bilirubin: 0.9 mg/dL (ref 0.3–1.2)
Total Protein: 7.5 g/dL (ref 6.5–8.1)

## 2022-02-04 LAB — BRAIN NATRIURETIC PEPTIDE: B Natriuretic Peptide: 300 pg/mL — ABNORMAL HIGH (ref 0.0–100.0)

## 2022-02-04 MED ORDER — GUAIFENESIN-CODEINE 100-10 MG/5ML PO SOLN
5.0000 mL | Freq: Once | ORAL | Status: AC
  Administered 2022-02-04: 5 mL via ORAL
  Filled 2022-02-04: qty 5

## 2022-02-04 MED ORDER — METOPROLOL TARTRATE 5 MG/5ML IV SOLN
10.0000 mg | Freq: Once | INTRAVENOUS | Status: AC
  Administered 2022-02-04: 10 mg via INTRAVENOUS
  Filled 2022-02-04: qty 10

## 2022-02-04 MED ORDER — BENZONATATE 100 MG PO CAPS: 100.0000 mg | ORAL_CAPSULE | Freq: Three times a day (TID) | ORAL | 0 refills | Status: DC

## 2022-02-04 MED ORDER — LABETALOL HCL 5 MG/ML IV SOLN
20.0000 mg | Freq: Once | INTRAVENOUS | Status: AC
  Administered 2022-02-04: 20 mg via INTRAVENOUS
  Filled 2022-02-04: qty 4

## 2022-02-04 NOTE — ED Triage Notes (Signed)
Pt from home, states dialysis nurse told him to come be checked out at ED. Symptoms began this morning, cough, runny nose, congestion, 1 occurrence of vomiting. Denies N/D.  Pt is a poor historian, giving brief one word answers. No acute distress noted at this time.

## 2022-02-04 NOTE — Discharge Instructions (Addendum)
Get help right away if you: Develop a severe headache or confusion. Have unusual weakness or numbness. Feel faint. Have severe pain in your chest or abdomen. Vomit repeatedly. Have trouble breathing.  You appear to have an upper respiratory infection.  Your COVID test was negative and your chest x-ray is negative for any signs of pneumonia.  Your blood pressure was markedly elevated today.  I was able to get it to below the level of hypertensive emergency but you will need to see your primary care doctor and very close follow-up for management of your blood pressures.  Please take all of your medications as directed.  If you are only taking the clonidine patch you likely need to take more medications.

## 2022-02-04 NOTE — ED Provider Notes (Signed)
[facility] Provider Note   CSN: [id number] Arrival date & time: 02/04/22  1533     History {Add pertinent medical, surgical, social history, OB history to HPI:1} Chief Complaint  Patient presents with  . Cough    Justin Barrett is a 61 y.o. male who presents to the ed for evaluation of cough. He had onset of cough. Headache, fatigue, malaise beginning yesterday. He has a pmh of ESRD, HD on M/W/F. He is compliant with dialysis regimen and completed a full session pta today. He has a hx of HTN and took his medications today . He is wearing his clonidine patch. He has no hx of asthma. He did not take otc cough medicine or    Cough     Home Medications Prior to Admission medications   Medication Sig Start Date End Date Taking? Authorizing Provider  cloNIDine (CATAPRES - DOSED IN MG/24 HR) 0.2 mg/24hr patch Place 0.2 mg onto the skin once a week. 11/13/20   [provider]  cloNIDine (CATAPRES) 0.2 MG tablet Take 2 tablets by mouth daily.  Patient not taking: Reported on 12/13/2020 01/04/15   [provider]  cloNIDine (CATAPRES) 0.2 MG tablet Take an extra clonidine for blood pressure that remains elevated Patient not taking: No sig reported 05/20/19   Rolland Porter, MD  lisinopril (PRINIVIL,ZESTRIL) 40 MG tablet Take 20 mg by mouth daily.    [provider]  sevelamer carbonate (RENVELA) 800 MG tablet Take 2,400 mg by mouth 3 (three) times daily with meals. Patient not taking: Reported on 12/13/2020    [provider]  sodium bicarbonate 650 MG tablet Take 1,300 mg by mouth daily. Patient not taking: Reported on 12/13/2020    [provider]      Allergies    Patient has no known allergies.    Review of Systems   Review of Systems  Respiratory:  Positive for cough.    Physical Exam Updated Vital Signs Pulse 90   Temp 99.3 F (37.4 C) (Oral)   Resp 20   Ht 5\' 11"  (1.803 m)   Wt 74.8 kg   SpO2 96%   BMI 23.01 kg/m   Physical Exam  ED Results / Procedures / Treatments   Labs (all labs ordered are listed, but only abnormal results are displayed) Labs Reviewed  RESP PANEL BY RT-PCR (FLU A&B, COVID) ARPGX2  COMPREHENSIVE METABOLIC PANEL  CBC  BRAIN NATRIURETIC PEPTIDE    EKG None  Radiology No results found.  Procedures Procedures  {Document cardiac monitor, telemetry assessment procedure when appropriate:1}  Medications Ordered in ED Medications - No data to display  ED Course/ Medical Decision Making/ A&P Clinical Course as of 02/04/22 1914  Wed Feb 04, 2022  1841 Comprehensive metabolic panel(!) [AH]  8341 Creatinine(!): 5.13 [AH]  1841 Brain natriuretic peptide(!) [AH]  1841 CBC(!) [AH]  1841 Hemoglobin(!): 11.8 [AH]  1841 Resp Panel by RT-PCR (Flu A&B, Covid) Anterior Nasal Swab [AH]    Clinical Course User Index [AH] Margarita Mail, PA-C                           Medical Decision Making Amount and/or Complexity of Data Reviewed Labs: ordered. Radiology: ordered.  Risk Prescription drug management.   ***  {Document critical care time when appropriate:1} {Document review of labs and clinical decision tools ie heart score, Chads2Vasc2 etc:1}  {Document your independent review of radiology images, and any outside  records:1} {Document your discussion with family members, caretakers, and with consultants:1} {Document social determinants of health affecting pt's care:1} {Document your decision making why or why not admission, treatments were needed:1} Final Clinical Impression(s) / ED Diagnoses Final diagnoses:  None    Rx / DC Orders ED Discharge Orders     None

## 2022-02-04 NOTE — ED Provider Notes (Incomplete)
[facility] Provider Note   CSN: [id number] Arrival date & time: 02/04/22  1533     History {Add pertinent medical, surgical, social history, OB history to HPI:1} Chief Complaint  Patient presents with  . Cough    SHANTEL WESELY is a 61 y.o. male who presents to the ed for evaluation of cough. He had onset of cough. Headache, fatigue, malaise beginning yesterday. He has a pmh of ESRD, HD on M/W/F. He is compliant with dialysis regimen and completed a full session pta today. He has a hx of HTN and took his medications today . He is wearing his clonidine patch. He has no hx of asthma. He did not take otc cough medicine.   Cough     Home Medications Prior to Admission medications   Medication Sig Start Date End Date Taking? Authorizing Provider  cloNIDine (CATAPRES - DOSED IN MG/24 HR) 0.2 mg/24hr patch Place 0.2 mg onto the skin once a week. 11/13/20   [provider]  cloNIDine (CATAPRES) 0.2 MG tablet Take 2 tablets by mouth daily.  Patient not taking: Reported on 12/13/2020 01/04/15   [provider]  cloNIDine (CATAPRES) 0.2 MG tablet Take an extra clonidine for blood pressure that remains elevated Patient not taking: No sig reported 05/20/19   Rolland Porter, MD  lisinopril (PRINIVIL,ZESTRIL) 40 MG tablet Take 20 mg by mouth daily.    [provider]  sevelamer carbonate (RENVELA) 800 MG tablet Take 2,400 mg by mouth 3 (three) times daily with meals. Patient not taking: Reported on 12/13/2020    [provider]  sodium bicarbonate 650 MG tablet Take 1,300 mg by mouth daily. Patient not taking: Reported on 12/13/2020    [provider]      Allergies    Patient has no known allergies.    Review of Systems   Review of Systems  Respiratory:  Positive for cough.    Physical Exam Updated Vital Signs Pulse 90   Temp 99.3 F (37.4 C) (Oral)   Resp 20   Ht 5\' 11"  (1.803 m)   Wt 74.8 kg   SpO2 96%   BMI 23.01 kg/m   Physical Exam Vitals and nursing note reviewed.  Constitutional:      General: He is not in acute distress.    Appearance: He is well-developed. He is ill-appearing. He is not toxic-appearing or diaphoretic.  HENT:     Head: Normocephalic and atraumatic.  Eyes:     General: No scleral icterus.    Conjunctiva/sclera: Conjunctivae normal.  Cardiovascular:     Rate and Rhythm: Normal rate and regular rhythm.     Heart sounds: Normal heart sounds.  Pulmonary:     Effort: Pulmonary effort is normal.     Breath sounds: Normal breath sounds. No wheezing.  Abdominal:     Palpations: Abdomen is soft.     Tenderness: There is no abdominal tenderness.  Musculoskeletal:     Cervical back: Normal range of motion and neck supple.  Skin:    General: Skin is warm and dry.  Neurological:     Mental Status: He is alert.  Psychiatric:        Behavior: Behavior normal.    ED Results / Procedures / Treatments   Labs (all labs ordered are listed, but only abnormal results are displayed) Labs Reviewed  RESP PANEL BY RT-PCR (FLU A&B, COVID) ARPGX2  COMPREHENSIVE METABOLIC PANEL  CBC  BRAIN NATRIURETIC PEPTIDE    EKG  EKG Interpretation  Date/Time:  Wednesday Feb 04 2022 16:32:31 EDT Ventricular Rate:  84 PR Interval:  146 QRS Duration: 81 QT Interval:  393 QTC Calculation: 465 R Axis:   40 Text Interpretation: Sinus rhythm Probable left atrial enlargement Minimal ST elevation, inferior leads t wave changes no longer present compared to prior tracing Confirmed by Dorie Rank [phone]) on 02/04/2022 4:36:32 PM  Radiology No results found.  Procedures Procedures    Medications Ordered in ED Medications - No data to display  ED Course/ Medical Decision Making/ A&P Clinical Course as of 02/04/22 1914  Wed Feb 04, 2022  1841 Comprehensive metabolic panel(!) [AH]  8891 Creatinine(!): 5.13 [AH]  1841 Brain natriuretic peptide(!) [AH]  1841 CBC(!) [AH]  1841 Hemoglobin(!): 11.8 [AH]   1841 Resp Panel by RT-PCR (Flu A&B, Covid) Anterior Nasal Swab [AH]    Clinical Course User Index [AH] Margarita Mail, PA-C                           Medical Decision Making 61 year old male with history of end-stage renal disease who presents after going to dialysis with hypertension and URI symptoms. Differential diagnosis for emergent cause of cough includes but is not limited to upper respiratory infection, lower respiratory infection, allergies, asthma, irritants, foreign body, medications such as ACE inhibitors, reflux, asthma, CHF, lung cancer, interstitial lung disease, psychiatric causes, postnasal drip and postinfectious bronchospasm.  After review of labs and data points does not appear to have COVID, pulmonary edema, pneumonia.  Patient's blood pressure addressed in the ER with a few rounds of IV antihypertensives.  Patient had significant improvement in his blood pressure.  He is encouraged to follow-up with his PCP for further management.  Amount and/or Complexity of Data Reviewed Labs: ordered. Decision-making details documented in ED Course.    Details: I ordered and reviewed labs, BNP is only moderately elevated, CMP shows elevated creatinine consistent with history of end-stage renal disease, CBC with normocytic anemia likely of chronic disease, respiratory panel negative, Radiology: ordered.    Details: Right ordered, visualized and interpreted 1 view chest x-ray which shows no abnormal pulmonary findings. ECG/medicine tests: ordered and independent interpretation performed.    Details: I ordered and reviewed EKG which shows sinus rhythm at a rate of 84  Risk OTC drugs. Prescription drug management.     {Document critical care time when appropriate:1} {Document review of labs and clinical decision tools ie heart score, Chads2Vasc2 etc:1}  {Document your independent review of radiology images, and any outside records:1} {Document your discussion with family members,  caretakers, and with consultants:1} {Document social determinants of health affecting pt's care:1} {Document your decision making why or why not admission, treatments were needed:1} Final Clinical Impression(s) / ED Diagnoses Final diagnoses:  Upper respiratory tract infection, unspecified type  Accelerated hypertension    Rx / DC Orders ED Discharge Orders     None

## 2022-02-04 NOTE — ED Notes (Signed)
Pt manual BP 210/114. Kenton Kingfisher, [place] made aware.

## 2022-04-09 ENCOUNTER — Other Ambulatory Visit: Payer: Self-pay | Admitting: Family Medicine

## 2022-04-09 ENCOUNTER — Other Ambulatory Visit (HOSPITAL_COMMUNITY): Payer: Self-pay | Admitting: Family Medicine

## 2022-04-09 DIAGNOSIS — Z136 Encounter for screening for cardiovascular disorders: Secondary | ICD-10-CM

## 2022-04-21 ENCOUNTER — Encounter (HOSPITAL_COMMUNITY): Payer: Self-pay

## 2022-04-21 ENCOUNTER — Ambulatory Visit (HOSPITAL_COMMUNITY): Admission: RE | Admit: 2022-04-21 | Payer: Medicare Other | Source: Ambulatory Visit

## 2022-04-30 ENCOUNTER — Emergency Department (HOSPITAL_COMMUNITY)
Admission: EM | Admit: 2022-04-30 | Discharge: 2022-04-30 | Disposition: A | Discharge status: Home / Self Care | Payer: Medicare Other | Attending: Emergency Medicine | Admitting: Emergency Medicine

## 2022-04-30 ENCOUNTER — Encounter (HOSPITAL_COMMUNITY): Payer: Self-pay | Admitting: Emergency Medicine

## 2022-04-30 ENCOUNTER — Other Ambulatory Visit: Payer: Self-pay

## 2022-04-30 DIAGNOSIS — N189 Chronic kidney disease, unspecified: Secondary | ICD-10-CM | POA: Insufficient documentation

## 2022-04-30 DIAGNOSIS — R1903 Right lower quadrant abdominal swelling, mass and lump: Secondary | ICD-10-CM | POA: Diagnosis present

## 2022-04-30 DIAGNOSIS — K4090 Unilateral inguinal hernia, without obstruction or gangrene, not specified as recurrent: Secondary | ICD-10-CM | POA: Diagnosis not present

## 2022-04-30 NOTE — ED Triage Notes (Signed)
Pt presents with hernia to lower abdomen x 3 months, denies any pain.

## 2022-04-30 NOTE — Discharge Instructions (Signed)
Diagnosed with inguinal hernia. Recommend follow up with general surgery. I have provided contact information in discharge summary. Otherwise, if you have new abdominal pain or distention, nausea and vomiting, fever, and/or abdominal distention please return to the emergency department for further evaluation.

## 2022-04-30 NOTE — ED Provider Notes (Signed)
[facility] EMERGENCY DEPARTMENT Provider Note   CSN: [id number] Arrival date & time: 04/30/22  1340     History  Chief Complaint  Patient presents with   Hernia    Justin Barrett is a 61 y.o. male with CKD presenting for concern for hernia.  Noticed a hernia 6 months ago.  Located in the lower right abdomen just above the base of the penis.  Size is "smaller than fist".  Endorses that it is reproducible and mildly tender.  Denies swelling or redness.  Denies abdominal distention.  States that he has had normal bowel movements and no changes in urination.  Denies nausea vomiting diarrhea.  Denies fever.  Denies abdominal pain.  Patient stated that he is listed for kidney transplant followed at [facility].  He was a advised by "the kidney doctor" that hernia must be treated before he could be eligible to receive a kidney.  This prompted him to be evaluated today in the emergency room.  HPI     Home Medications Prior to Admission medications   Medication Sig Start Date End Date Taking? Authorizing Provider  benzonatate (TESSALON) 100 MG capsule Take 1 capsule (100 mg total) by mouth every 8 (eight) hours. 02/04/22   Margarita Mail, PA-C  cloNIDine (CATAPRES - DOSED IN MG/24 HR) 0.2 mg/24hr patch Place 0.2 mg onto the skin once a week. 11/13/20   [provider]  cloNIDine (CATAPRES) 0.2 MG tablet Take 2 tablets by mouth daily.  Patient not taking: Reported on 12/13/2020 01/04/15   [provider]  cloNIDine (CATAPRES) 0.2 MG tablet Take an extra clonidine for blood pressure that remains elevated Patient not taking: No sig reported 05/20/19   Rolland Porter, MD  lisinopril (PRINIVIL,ZESTRIL) 40 MG tablet Take 20 mg by mouth daily.    [provider]  sevelamer carbonate (RENVELA) 800 MG tablet Take 2,400 mg by mouth 3 (three) times daily with meals. Patient not taking: Reported on 12/13/2020    [provider]  sodium bicarbonate 650 MG tablet Take 1,300 mg by mouth  daily. Patient not taking: Reported on 12/13/2020    [provider]      Allergies    Patient has no known allergies.    Review of Systems   See HPI for pertinent ROS  Physical Exam Updated Vital Signs BP (!) 180/100   Pulse (!) 55   Temp 98.6 F (37 C) (Oral)   Resp 18   Ht 5\' 11"  (1.803 m)   Wt 72.6 kg   SpO2 100%   BMI 22.32 kg/m  Physical Exam Vitals and nursing note reviewed.  HENT:     Head: Normocephalic and atraumatic.     Mouth/Throat:     Mouth: Mucous membranes are moist.  Eyes:     General:        Right eye: No discharge.        Left eye: No discharge.     Conjunctiva/sclera: Conjunctivae normal.  Cardiovascular:     Rate and Rhythm: Normal rate and regular rhythm.     Pulses: Normal pulses.     Heart sounds: Normal heart sounds.  Pulmonary:     Effort: Pulmonary effort is normal.     Breath sounds: Normal breath sounds.  Abdominal:     General: Abdomen is flat. Bowel sounds are normal.     Palpations: Abdomen is soft.     Comments: Reducible mass in the RLQ. No swelling, erythema.   Skin:  General: Skin is warm and dry.  Neurological:     General: No focal deficit present.  Psychiatric:        Mood and Affect: Mood normal.     ED Results / Procedures / Treatments   Labs (all labs ordered are listed, but only abnormal results are displayed) Labs Reviewed - No data to display  EKG None  Radiology No results found.  Procedures Hernia reduction  Date/Time: 04/30/2022 5:09 PM  Performed by: Harriet Pho, PA-C Authorized by: Harriet Pho, PA-C  Consent: Verbal consent obtained. Consent given by: patient Patient understanding: patient states understanding of the procedure being performed Patient identity confirmed: verbally with patient and arm band Local anesthesia used: no  Anesthesia: Local anesthesia used: no  Sedation: Patient sedated: no  Patient tolerance: patient tolerated the procedure well with no  immediate complications       Medications Ordered in ED Medications - No data to display  ED Course/ Medical Decision Making/ A&P                           Medical Decision Making  Patient presented for concern for hernia.  On exam, reducible mass in the left lower quadrant just above the base of the penis was noted.  Mass was easily reducible but quickly protruded to its normal position after reduction.  Belly was not distended nor has he had trouble with bowel movements making a strangulated or incarcerated hernia less likely.  Overall likely a direct inguinal hernia.  Patient is mostly concerned that he will not receive a kidney if the hernia is not repaired.  I have referred him to a local surgery office for further evaluation and possible surgical management. Discussed return precautions.        Final Clinical Impression(s) / ED Diagnoses Final diagnoses:  Unilateral inguinal hernia without obstruction or gangrene, recurrence not specified    Rx / DC Orders ED Discharge Orders     None         Harriet Pho, PA-C 04/30/22 1720    Sherwood Gambler, MD 05/01/22 2330

## 2022-06-04 ENCOUNTER — Encounter: Payer: Self-pay | Admitting: Surgery

## 2022-06-04 ENCOUNTER — Ambulatory Visit (INDEPENDENT_AMBULATORY_CARE_PROVIDER_SITE_OTHER): Payer: Medicare Other | Admitting: Surgery

## 2022-06-04 VITALS — BP 163/98 | HR 85 | Temp 98.0°F | Resp 14 | Ht 71.0 in | Wt 154.0 lb

## 2022-06-04 DIAGNOSIS — K4090 Unilateral inguinal hernia, without obstruction or gangrene, not specified as recurrent: Secondary | ICD-10-CM

## 2022-06-04 NOTE — Progress Notes (Signed)
[facility] History and Physical  Reason for Referral:Right inguinal hernia Referring Physician: [facility] ED  Chief Complaint   New Patient (Initial Visit)     Justin Barrett is a 61 y.o. male.  HPI: Patient is presenting for a right inguinal hernia.  He is a kidney transplant candidate, and was advised that his hernia would need to be repaired prior to him receiving a kidney.  It has been present for 6 to 9 months, at which time he was diagnosed with it at the [facility].  He denies any pain associated with the hernia, though he does note a bulge that usually reduces on its own.  He denies any nausea or vomiting, and he is moving his bowels without issue.  His last bowel movement was this morning.  He has a surgical history significant for an appendectomy.  His past medical history significant for hypertension and end-stage renal disease.  He receives hemodialysis Monday, Wednesday, and Friday through a left upper extremity fistula.  He denies use of blood thinning medications.  He denies any tobacco products, alcohol, and illicit drugs.  Past Medical History:  Diagnosis Date   End stage renal failure on dialysis ([place])    HTN (hypertension)     Past Surgical History:  Procedure Laterality Date   APPENDECTOMY  2010   AV FISTULA PLACEMENT     IR FLUORO GUIDE CV LINE LEFT  09/01/2017   IR US GUIDE VASC ACCESS LEFT  09/01/2017    Family History  Problem Relation Age of Onset   Colon cancer Neg Hx        not known    Social History   Tobacco Use   Smoking status: Never   Smokeless tobacco: Never  Vaping Use   Vaping Use: Never used  Substance Use Topics   Alcohol use: No    Alcohol/week: 0.0 standard drinks of alcohol   Drug use: No    Medications: I have reviewed the patient's current medications. Allergies as of 06/04/2022   No Known Allergies      Medication List        Accurate as of June 04, 2022  9:09 AM. If you have any questions, ask your  nurse or doctor.          STOP taking these medications    sevelamer carbonate 800 MG tablet Commonly known as: RENVELA   sodium bicarbonate 650 MG tablet       TAKE these medications    aspirin EC 81 MG tablet Take by mouth.   atorvastatin 80 MG tablet Commonly known as: LIPITOR TAKE ONE TABLET BY MOUTH AT BEDTIME FOR CHOLESTEROL   benzonatate 100 MG capsule Commonly known as: TESSALON Take 1 capsule (100 mg total) by mouth every 8 (eight) hours.   cloNIDine 0.2 MG tablet Commonly known as: CATAPRES Take an extra clonidine for blood pressure that remains elevated What changed: Another medication with the same name was removed. Continue taking this medication, and follow the directions you see here.   cloNIDine 0.2 mg/24hr patch Commonly known as: CATAPRES - Dosed in mg/24 hr Place 0.2 mg onto the skin once a week.   doxazosin 1 MG tablet Commonly known as: CARDURA Take 1 mg by mouth daily.   lisinopril 40 MG tablet Commonly known as: ZESTRIL Take 20 mg by mouth daily.         ROS:  Constitutional: negative for chills, fatigue, and fevers Eyes: positive for visual disturbance, negative for  pain Ears, nose, mouth, throat, and face: negative for ear drainage, sore throat, and sinus problems Respiratory: negative for cough, wheenegative for chest pain and palpitationszing, and shortness of breath Cardiovascular: negative for chest pain and palpitations Gastrointestinal: negative for abdominal pain, nausea, reflux symptoms, and vomiting Genitourinary:positive for frequency, negative for dysuria and urinary retention Integument/breast: negative for dryness and rash Hematologic/lymphatic: negative for bleeding and lymphadenopathy Musculoskeletal:negative for back pain and neck pain Neurological: negative for dizziness, tremors, and numbness Endocrine: negative for temperature intolerance  There were no vitals taken for this visit. Physical Exam Vitals  reviewed.  Constitutional:      Appearance: Normal appearance.  HENT:     Head: Normocephalic and atraumatic.  Eyes:     Extraocular Movements: Extraocular movements intact.     Pupils: Pupils are equal, round, and reactive to light.  Cardiovascular:     Rate and Rhythm: Normal rate and regular rhythm.  Pulmonary:     Effort: Pulmonary effort is normal.     Breath sounds: Normal breath sounds.  Abdominal:     General: There is no distension.     Palpations: Abdomen is soft.     Tenderness: There is no abdominal tenderness.  Genitourinary:    Comments: Soft and reducible right inguinal hernia Musculoskeletal:        General: Normal range of motion.     Cervical back: Normal range of motion.  Skin:    General: Skin is warm and dry.  Neurological:     General: No focal deficit present.     Mental Status: He is alert and oriented to person, place, and time.  Psychiatric:        Mood and Affect: Mood normal.        Behavior: Behavior normal.     Results: No results found for this or any previous visit (from the past 48 hour(s)).  No results found.   Assessment & Plan:  Justin Barrett is a 61 y.o. male who presents for evaluation of right inguinal hernia.  -I explained the pathophysiology of inguinal hernias, and why we recommend surgical repair -The risk and benefits of open right inguinal hernia repair with mesh were discussed including but not limited to bleeding, infection, injury to surrounding structures, need for additional procedure, and hernia recurrence.  After careful consideration, Justin Barrett has decided to proceed with open right inguinal hernia repair with mesh.  -Patient tentatively scheduled for surgery on 10/26 -Information provided to the patient regarding inguinal hernias -Advised him to present to the emergency department if he begins to have nausea, vomiting, obstipation, nonreducible and painful right groin bulge  All questions were answered to the  satisfaction of the patient.  Graciella Freer, DO [facility] [address] [place], [place] [address] [phone] (office)

## 2022-06-07 NOTE — H&P (Signed)
[facility] History and Physical   Reason for Referral:Right inguinal hernia Referring Physician: [facility] ED   Chief Complaint   New Patient (Initial Visit)        Justin Barrett is a 61 y.o. male.  HPI: Patient is presenting for a right inguinal hernia.  He is a kidney transplant candidate, and was advised that his hernia would need to be repaired prior to him receiving a kidney.  It has been present for 6 to 9 months, at which time he was diagnosed with it at the [facility].  He denies any pain associated with the hernia, though he does note a bulge that usually reduces on its own.  He denies any nausea or vomiting, and he is moving his bowels without issue.  His last bowel movement was this morning.  He has a surgical history significant for an appendectomy.  His past medical history significant for hypertension and end-stage renal disease.  He receives hemodialysis Monday, Wednesday, and Friday through a left upper extremity fistula.  He denies use of blood thinning medications.  He denies any tobacco products, alcohol, and illicit drugs.       Past Medical History:  Diagnosis Date   End stage renal failure on dialysis ([place])     HTN (hypertension)             Past Surgical History:  Procedure Laterality Date   APPENDECTOMY   2010   AV FISTULA PLACEMENT       IR FLUORO GUIDE CV LINE LEFT   09/01/2017   IR US GUIDE VASC ACCESS LEFT   09/01/2017           Family History  Problem Relation Age of Onset   Colon cancer Neg Hx          not known      Social History         Tobacco Use   Smoking status: Never   Smokeless tobacco: Never  Vaping Use   Vaping Use: Never used  Substance Use Topics   Alcohol use: No      Alcohol/week: 0.0 standard drinks of alcohol   Drug use: No      Medications: I have reviewed the patient's current medications. Allergies as of 06/04/2022   No Known Allergies         Medication List           Accurate as of June 04, 2022  9:09 AM. If you have any questions, ask your nurse or doctor.              STOP taking these medications     sevelamer carbonate 800 MG tablet Commonly known as: RENVELA    sodium bicarbonate 650 MG tablet           TAKE these medications     aspirin EC 81 MG tablet Take by mouth.    atorvastatin 80 MG tablet Commonly known as: LIPITOR TAKE ONE TABLET BY MOUTH AT BEDTIME FOR CHOLESTEROL    benzonatate 100 MG capsule Commonly known as: TESSALON Take 1 capsule (100 mg total) by mouth every 8 (eight) hours.    cloNIDine 0.2 MG tablet Commonly known as: CATAPRES Take an extra clonidine for blood pressure that remains elevated What changed: Another medication with the same name was removed. Continue taking this medication, and follow the directions you see here.    cloNIDine 0.2 mg/24hr patch Commonly known as: CATAPRES - Dosed in mg/24  hr Place 0.2 mg onto the skin once a week.    doxazosin 1 MG tablet Commonly known as: CARDURA Take 1 mg by mouth daily.    lisinopril 40 MG tablet Commonly known as: ZESTRIL Take 20 mg by mouth daily.               ROS:  Constitutional: negative for chills, fatigue, and fevers Eyes: positive for visual disturbance, negative for pain Ears, nose, mouth, throat, and face: negative for ear drainage, sore throat, and sinus problems Respiratory: negative for cough, wheenegative for chest pain and palpitationszing, and shortness of breath Cardiovascular: negative for chest pain and palpitations Gastrointestinal: negative for abdominal pain, nausea, reflux symptoms, and vomiting Genitourinary:positive for frequency, negative for dysuria and urinary retention Integument/breast: negative for dryness and rash Hematologic/lymphatic: negative for bleeding and lymphadenopathy Musculoskeletal:negative for back pain and neck pain Neurological: negative for dizziness, tremors, and numbness Endocrine: negative for temperature  intolerance   There were no vitals taken for this visit. Physical Exam Vitals reviewed.  Constitutional:      Appearance: Normal appearance.  HENT:     Head: Normocephalic and atraumatic.  Eyes:     Extraocular Movements: Extraocular movements intact.     Pupils: Pupils are equal, round, and reactive to light.  Cardiovascular:     Rate and Rhythm: Normal rate and regular rhythm.  Pulmonary:     Effort: Pulmonary effort is normal.     Breath sounds: Normal breath sounds.  Abdominal:     General: There is no distension.     Palpations: Abdomen is soft.     Tenderness: There is no abdominal tenderness.  Genitourinary:    Comments: Soft and reducible right inguinal hernia Musculoskeletal:        General: Normal range of motion.     Cervical back: Normal range of motion.  Skin:    General: Skin is warm and dry.  Neurological:     General: No focal deficit present.     Mental Status: He is alert and oriented to person, place, and time.  Psychiatric:        Mood and Affect: Mood normal.        Behavior: Behavior normal.        Results: Lab Results Last 48 Hours  No results found for this or any previous visit (from the past 48 hour(s)).     Imaging Results (Last 48 hours)  No results found.       Assessment & Plan:  Justin Barrett is a 61 y.o. male who presents for evaluation of right inguinal hernia.   -I explained the pathophysiology of inguinal hernias, and why we recommend surgical repair -The risk and benefits of open right inguinal hernia repair with mesh were discussed including but not limited to bleeding, infection, injury to surrounding structures, need for additional procedure, and hernia recurrence.  After careful consideration, Justin Barrett has decided to proceed with open right inguinal hernia repair with mesh.  -Patient tentatively scheduled for surgery on 10/26 -Information provided to the patient regarding inguinal hernias -Advised him to present to  the emergency department if he begins to have nausea, vomiting, obstipation, nonreducible and painful right groin bulge   All questions were answered to the satisfaction of the patient.   Graciella Freer, DO [facility] [address] [place], [place] [address] [phone] (office)

## 2022-06-22 ENCOUNTER — Other Ambulatory Visit: Payer: Self-pay

## 2022-06-22 ENCOUNTER — Emergency Department (HOSPITAL_COMMUNITY)
Admission: EM | Admit: 2022-06-22 | Discharge: 2022-06-22 | Disposition: A | Discharge status: Home / Self Care | Payer: Medicare Other | Attending: Emergency Medicine | Admitting: Emergency Medicine

## 2022-06-22 ENCOUNTER — Encounter (HOSPITAL_COMMUNITY): Payer: Self-pay | Admitting: *Deleted

## 2022-06-22 DIAGNOSIS — Y731 Therapeutic (nonsurgical) and rehabilitative gastroenterology and urology devices associated with adverse incidents: Secondary | ICD-10-CM | POA: Insufficient documentation

## 2022-06-22 DIAGNOSIS — T8249XA Other complication of vascular dialysis catheter, initial encounter: Secondary | ICD-10-CM | POA: Diagnosis present

## 2022-06-22 DIAGNOSIS — Z992 Dependence on renal dialysis: Secondary | ICD-10-CM | POA: Insufficient documentation

## 2022-06-22 DIAGNOSIS — T82898A Other specified complication of vascular prosthetic devices, implants and grafts, initial encounter: Secondary | ICD-10-CM

## 2022-06-22 DIAGNOSIS — T82838A Hemorrhage of vascular prosthetic devices, implants and grafts, initial encounter: Secondary | ICD-10-CM | POA: Diagnosis not present

## 2022-06-22 DIAGNOSIS — Z7982 Long term (current) use of aspirin: Secondary | ICD-10-CM | POA: Diagnosis not present

## 2022-06-22 DIAGNOSIS — Z79899 Other long term (current) drug therapy: Secondary | ICD-10-CM | POA: Diagnosis not present

## 2022-06-22 LAB — BASIC METABOLIC PANEL
Anion gap: 9 (ref 5–15)
BUN: 17 mg/dL (ref 6–20)
CO2: 30 mmol/L (ref 22–32)
Calcium: 8.6 mg/dL — ABNORMAL LOW (ref 8.9–10.3)
Chloride: 103 mmol/L (ref 98–111)
Creatinine, Ser: 5.36 mg/dL — ABNORMAL HIGH (ref 0.61–1.24)
GFR, Estimated: 11 mL/min — ABNORMAL LOW (ref >60)
Glucose, Bld: 82 mg/dL (ref 70–99)
Potassium: 3.5 mmol/L (ref 3.5–5.1)
Sodium: 142 mmol/L (ref 135–145)

## 2022-06-22 LAB — CBC
HCT: 33.1 % — ABNORMAL LOW (ref 39.0–52.0)
Hemoglobin: 11.2 g/dL — ABNORMAL LOW (ref 13.0–17.0)
MCH: 27.3 pg (ref 26.0–34.0)
MCHC: 33.8 g/dL (ref 30.0–36.0)
MCV: 80.5 fL (ref 80.0–100.0)
Platelets: 205 10*3/uL (ref 150–400)
RBC: 4.11 MIL/uL — ABNORMAL LOW (ref 4.22–5.81)
RDW: 15.1 % (ref 11.5–15.5)
WBC: 4.4 10*3/uL (ref 4.0–10.5)
nRBC: 0 % (ref 0.0–0.2)

## 2022-06-22 LAB — APTT: aPTT: 31 seconds (ref 24–36)

## 2022-06-22 NOTE — ED Notes (Signed)
ED Provider at bedside. 

## 2022-06-22 NOTE — ED Provider Notes (Signed)
[facility] EMERGENCY DEPARTMENT Provider Note   CSN: [id number] Arrival date & time: 06/22/22  1552     History  Chief Complaint  Patient presents with   Vascular Access Problem    Justin Barrett is a 61 y.o. male.  HPI Patient is dialysis patient.  Dialyzed today.  Reportedly was able to do full dialysis but after had bleeding from his access.  Reportedly was bleeding from both needle accesses but now just bleeding from 1.  Received heparin with dialysis.  Patient feels okay.  No lightheadedness or dizziness.  Not on blood thinners besides the heparin he was given.  No numbness or weakness.    Home Medications Prior to Admission medications   Medication Sig Start Date End Date Taking? Authorizing Provider  aspirin EC 81 MG tablet Take by mouth.    [provider]  atorvastatin (LIPITOR) 80 MG tablet TAKE ONE TABLET BY MOUTH AT BEDTIME FOR CHOLESTEROL 06/04/21 06/05/22  [provider]  cloNIDine (CATAPRES - DOSED IN MG/24 HR) 0.2 mg/24hr patch Place 0.2 mg onto the skin once a week. 11/13/20   [provider]  cloNIDine (CATAPRES) 0.2 MG tablet Take an extra clonidine for blood pressure that remains elevated 05/20/19   Rolland Porter, MD  doxazosin (CARDURA) 1 MG tablet Take 1 mg by mouth daily. 05/19/22   [provider]  lisinopril (PRINIVIL,ZESTRIL) 40 MG tablet Take 20 mg by mouth daily.    [provider]      Allergies    Patient has no known allergies.    Review of Systems   Review of Systems  Physical Exam Updated Vital Signs BP (!) 188/100   Pulse (!) 59   Temp 98.9 F (37.2 C) (Oral)   Resp 15   Ht 5\' 11"  (1.803 m)   Wt 69.9 kg   SpO2 99%   BMI 21.48 kg/m  Physical Exam Vitals and nursing note reviewed.  HENT:     Head: Atraumatic.  Abdominal:     Tenderness: There is no abdominal tenderness.  Musculoskeletal:     Comments: Dialysis graft left upper arm.  The superior aspect of it has a plastic clamp on it.  If  this is loosened there is recurrent bleeding.  Still thrill in the graft.  Neurological:     Mental Status: He is alert.     ED Results / Procedures / Treatments   Labs (all labs ordered are listed, but only abnormal results are displayed) Labs Reviewed  CBC - Abnormal; Notable for the following components:      Result Value   RBC 4.11 (*)    Hemoglobin 11.2 (*)    HCT 33.1 (*)    All other components within normal limits  BASIC METABOLIC PANEL - Abnormal; Notable for the following components:   Creatinine, Ser 5.36 (*)    Calcium 8.6 (*)    GFR, Estimated 11 (*)    All other components within normal limits  APTT    EKG None  Radiology No results found.  Procedures Procedures    Medications Ordered in ED Medications - No data to display  ED Course/ Medical Decision Making/ A&P                           Medical Decision Making Amount and/or Complexity of Data Reviewed Labs: ordered.  Patient with bleeding from dialysis graft.  Reportedly did not have a very large amount of  bleeding at the scene.  Bleeding controlled with a clamp at this point.  However if I loosen it it will recur.  We will check basic blood work.  We will wait for patient to metabolize more of the heparin he was given and hopefully will help clot.  Bleeding appears controlled with a clamp on at this time.  After some time waiting to help metabolize off the heparin the clamp was removed and no active bleeding.  Pressure dressing placed over graft/fistula.  Discharge home and will leave dressing on until Wednesday.       Final Clinical Impression(s) / ED Diagnoses Final diagnoses:  Hemorrhage associated with renal dialysis graft [facility])    Rx / [place] Orders ED Discharge Orders     None         Davonna Belling, MD 06/22/22 1751

## 2022-06-22 NOTE — ED Triage Notes (Signed)
Pt brought in by [facility] from dialysis with c/o dialysis fistula bleeding after his dialysis was completed. Pt arrives with dialysis clamp in place. BP 192/99 for EMS.

## 2022-06-22 NOTE — Discharge Instructions (Signed)
The graft to stop bleeding.  Watch for further breathing.  If it does return hold direct pressure over the area to stop it and return to the ER.  Leave the dressing on for now until dialysis on Wednesday.

## 2022-06-30 ENCOUNTER — Encounter (HOSPITAL_COMMUNITY)
Admission: RE | Admit: 2022-06-30 | Discharge: 2022-06-30 | Disposition: A | Discharge status: Home / Self Care | Payer: Medicare Other | Source: Ambulatory Visit | Attending: Surgery | Admitting: Surgery

## 2022-06-30 DIAGNOSIS — N186 End stage renal disease: Secondary | ICD-10-CM

## 2022-07-02 ENCOUNTER — Ambulatory Visit (HOSPITAL_BASED_OUTPATIENT_CLINIC_OR_DEPARTMENT_OTHER): Payer: Medicare Other

## 2022-07-02 ENCOUNTER — Encounter (HOSPITAL_COMMUNITY)
Admission: RE | Disposition: A | Discharge status: Home / Self Care | Payer: Self-pay | Source: Home / Self Care | Attending: Surgery

## 2022-07-02 ENCOUNTER — Ambulatory Visit (HOSPITAL_COMMUNITY)
Admission: RE | Admit: 2022-07-02 | Discharge: 2022-07-02 | Disposition: A | Discharge status: Home / Self Care | Payer: Medicare Other | Attending: Surgery | Admitting: Surgery

## 2022-07-02 ENCOUNTER — Ambulatory Visit (HOSPITAL_COMMUNITY): Payer: Medicare Other

## 2022-07-02 ENCOUNTER — Encounter (HOSPITAL_COMMUNITY): Payer: Self-pay | Admitting: Surgery

## 2022-07-02 ENCOUNTER — Other Ambulatory Visit: Payer: Self-pay

## 2022-07-02 DIAGNOSIS — Z992 Dependence on renal dialysis: Secondary | ICD-10-CM

## 2022-07-02 DIAGNOSIS — I120 Hypertensive chronic kidney disease with stage 5 chronic kidney disease or end stage renal disease: Secondary | ICD-10-CM | POA: Diagnosis not present

## 2022-07-02 DIAGNOSIS — N186 End stage renal disease: Secondary | ICD-10-CM | POA: Insufficient documentation

## 2022-07-02 DIAGNOSIS — K4090 Unilateral inguinal hernia, without obstruction or gangrene, not specified as recurrent: Secondary | ICD-10-CM | POA: Diagnosis present

## 2022-07-02 HISTORY — PX: INGUINAL HERNIA REPAIR: SHX194

## 2022-07-02 LAB — POCT I-STAT, CHEM 8
BUN: 26 mg/dL — ABNORMAL HIGH (ref 6–20)
Calcium, Ion: 0.84 mmol/L — CL (ref 1.15–1.40)
Chloride: 103 mmol/L (ref 98–111)
Creatinine, Ser: 7.5 mg/dL — ABNORMAL HIGH (ref 0.61–1.24)
Glucose, Bld: 83 mg/dL (ref 70–99)
HCT: 37 % — ABNORMAL LOW (ref 39.0–52.0)
Hemoglobin: 12.6 g/dL — ABNORMAL LOW (ref 13.0–17.0)
Potassium: 5.4 mmol/L — ABNORMAL HIGH (ref 3.5–5.1)
Sodium: 137 mmol/L (ref 135–145)
TCO2: 27 mmol/L (ref 22–32)

## 2022-07-02 SURGERY — REPAIR, HERNIA, INGUINAL, ADULT
Anesthesia: General | Site: Inguinal | Laterality: Right

## 2022-07-02 MED ORDER — CHLORHEXIDINE GLUCONATE CLOTH 2 % EX PADS: 6.0000 | MEDICATED_PAD | Freq: Once | CUTANEOUS | Status: DC

## 2022-07-02 MED ORDER — OXYCODONE HCL 5 MG PO TABS: 5.0000 mg | ORAL_TABLET | Freq: Four times a day (QID) | ORAL | 0 refills | Status: AC | PRN

## 2022-07-02 MED ORDER — ACETAMINOPHEN 500 MG PO TABS: 1000.0000 mg | ORAL_TABLET | Freq: Four times a day (QID) | ORAL | 0 refills | Status: AC

## 2022-07-02 MED ORDER — FENTANYL CITRATE PF 50 MCG/ML IJ SOSY
25.0000 ug | PREFILLED_SYRINGE | INTRAMUSCULAR | Status: DC | PRN
  Administered 2022-07-02: 50 ug via INTRAVENOUS
  Filled 2022-07-02: qty 1

## 2022-07-02 MED ORDER — SODIUM CHLORIDE 0.9 % IV SOLN: INTRAVENOUS | Status: DC | PRN

## 2022-07-02 MED ORDER — BUPIVACAINE HCL (300 MG DOSE) 3 X 100 MG IL IMPL
DRUG_IMPLANT | Status: AC
  Filled 2022-07-02: qty 100

## 2022-07-02 MED ORDER — DEXAMETHASONE SODIUM PHOSPHATE 10 MG/ML IJ SOLN
INTRAMUSCULAR | Status: DC | PRN
  Administered 2022-07-02: 4 mg via INTRAVENOUS

## 2022-07-02 MED ORDER — CHLORHEXIDINE GLUCONATE 0.12 % MT SOLN
OROMUCOSAL | Status: AC
  Filled 2022-07-02: qty 45

## 2022-07-02 MED ORDER — FENTANYL CITRATE (PF) 100 MCG/2ML IJ SOLN
INTRAMUSCULAR | Status: DC | PRN
  Administered 2022-07-02 (×2): 25 ug via INTRAVENOUS

## 2022-07-02 MED ORDER — ASPIRIN 81 MG PO TBEC: 81.0000 mg | DELAYED_RELEASE_TABLET | Freq: Every day | ORAL | 12 refills | Status: AC

## 2022-07-02 MED ORDER — FENTANYL CITRATE (PF) 100 MCG/2ML IJ SOLN
INTRAMUSCULAR | Status: AC
  Filled 2022-07-02: qty 2

## 2022-07-02 MED ORDER — LACTATED RINGERS IV SOLN: INTRAVENOUS | Status: DC

## 2022-07-02 MED ORDER — CHLORHEXIDINE GLUCONATE 0.12 % MT SOLN: 15.0000 mL | Freq: Once | OROMUCOSAL | Status: DC

## 2022-07-02 MED ORDER — OXYCODONE HCL 5 MG/5ML PO SOLN: 5.0000 mg | Freq: Once | ORAL | Status: AC | PRN

## 2022-07-02 MED ORDER — PROPOFOL 10 MG/ML IV BOLUS
INTRAVENOUS | Status: DC | PRN
  Administered 2022-07-02: 200 mg via INTRAVENOUS

## 2022-07-02 MED ORDER — PROPOFOL 10 MG/ML IV BOLUS
INTRAVENOUS | Status: AC
  Filled 2022-07-02: qty 20

## 2022-07-02 MED ORDER — LIDOCAINE HCL (PF) 2 % IJ SOLN
INTRAMUSCULAR | Status: AC
  Filled 2022-07-02: qty 5

## 2022-07-02 MED ORDER — MIDAZOLAM HCL 5 MG/5ML IJ SOLN
INTRAMUSCULAR | Status: DC | PRN
  Administered 2022-07-02: 1 mg via INTRAVENOUS

## 2022-07-02 MED ORDER — ONDANSETRON HCL 4 MG/2ML IJ SOLN: 4.0000 mg | Freq: Once | INTRAMUSCULAR | Status: DC | PRN

## 2022-07-02 MED ORDER — OXYCODONE HCL 5 MG PO TABS
5.0000 mg | ORAL_TABLET | Freq: Once | ORAL | Status: AC | PRN
  Administered 2022-07-02: 5 mg via ORAL
  Filled 2022-07-02: qty 1

## 2022-07-02 MED ORDER — PHENYLEPHRINE HCL (PRESSORS) 10 MG/ML IV SOLN
INTRAVENOUS | Status: DC | PRN
  Administered 2022-07-02 (×4): 80 ug via INTRAVENOUS

## 2022-07-02 MED ORDER — BUPIVACAINE HCL (300 MG DOSE) 3 X 100 MG IL IMPL
DRUG_IMPLANT | Status: DC | PRN
  Administered 2022-07-02: 300 mg

## 2022-07-02 MED ORDER — MIDAZOLAM HCL 2 MG/2ML IJ SOLN
INTRAMUSCULAR | Status: AC
  Filled 2022-07-02: qty 2

## 2022-07-02 MED ORDER — DEXAMETHASONE SODIUM PHOSPHATE 10 MG/ML IJ SOLN
INTRAMUSCULAR | Status: AC
  Filled 2022-07-02: qty 1

## 2022-07-02 MED ORDER — SODIUM CHLORIDE 0.9 % IR SOLN
Status: DC | PRN
  Administered 2022-07-02: 1000 mL

## 2022-07-02 MED ORDER — ONDANSETRON HCL 4 MG/2ML IJ SOLN
INTRAMUSCULAR | Status: DC | PRN
  Administered 2022-07-02: 4 mg via INTRAVENOUS

## 2022-07-02 MED ORDER — DOCUSATE SODIUM 100 MG PO CAPS: 100.0000 mg | ORAL_CAPSULE | Freq: Two times a day (BID) | ORAL | 2 refills | Status: AC

## 2022-07-02 MED ORDER — LIDOCAINE 2% (20 MG/ML) 5 ML SYRINGE
INTRAMUSCULAR | Status: DC | PRN
  Administered 2022-07-02: 60 mg via INTRAVENOUS

## 2022-07-02 MED ORDER — ONDANSETRON HCL 4 MG/2ML IJ SOLN
INTRAMUSCULAR | Status: AC
  Filled 2022-07-02: qty 2

## 2022-07-02 MED ORDER — CEFAZOLIN SODIUM-DEXTROSE 2-4 GM/100ML-% IV SOLN
2.0000 g | INTRAVENOUS | Status: AC
  Administered 2022-07-02: 2 g via INTRAVENOUS
  Filled 2022-07-02: qty 100

## 2022-07-02 MED ORDER — ORAL CARE MOUTH RINSE: 15.0000 mL | Freq: Once | OROMUCOSAL | Status: DC

## 2022-07-02 SURGICAL SUPPLY — 30 items
ADH SKN CLS APL DERMABOND .7 (GAUZE/BANDAGES/DRESSINGS) ×1
CLOTH BEACON ORANGE TIMEOUT ST (SAFETY) ×2 IMPLANT
COVER LIGHT HANDLE STERIS (MISCELLANEOUS) ×4 IMPLANT
DERMABOND ADVANCED .7 DNX12 (GAUZE/BANDAGES/DRESSINGS) ×2 IMPLANT
DRAIN PENROSE 0.5X18 (DRAIN) ×2 IMPLANT
ELECT REM PT RETURN 9FT ADLT (ELECTROSURGICAL) ×1
ELECTRODE REM PT RTRN 9FT ADLT (ELECTROSURGICAL) ×2 IMPLANT
GAUZE SPONGE 4X4 12PLY STRL (GAUZE/BANDAGES/DRESSINGS) ×2 IMPLANT
GLOVE BIO SURGEON STRL SZ7 (GLOVE) IMPLANT
GLOVE BIOGEL PI IND STRL 6.5 (GLOVE) ×2 IMPLANT
GLOVE BIOGEL PI IND STRL 7.0 (GLOVE) ×4 IMPLANT
GLOVE BIOGEL PI IND STRL 7.5 (GLOVE) IMPLANT
GLOVE SURG SS PI 6.5 STRL IVOR (GLOVE) ×4 IMPLANT
GOWN STRL REUS W/TWL LRG LVL3 (GOWN DISPOSABLE) ×6 IMPLANT
INST SET MINOR GENERAL (KITS) ×2 IMPLANT
KIT TURNOVER KIT A (KITS) ×2 IMPLANT
MANIFOLD NEPTUNE II (INSTRUMENTS) ×2 IMPLANT
MESH PRE-SHAPE KEYHOLE 1.8X4 (Mesh General) IMPLANT
NS IRRIG 1000ML POUR BTL (IV SOLUTION) ×2 IMPLANT
PACK MINOR (CUSTOM PROCEDURE TRAY) ×2 IMPLANT
PAD ARMBOARD 7.5X6 YLW CONV (MISCELLANEOUS) ×2 IMPLANT
SET BASIN LINEN APH (SET/KITS/TRAYS/PACK) ×2 IMPLANT
SOL PREP PROV IODINE SCRUB 4OZ (MISCELLANEOUS) ×2 IMPLANT
SUT MNCRL AB 4-0 PS2 18 (SUTURE) ×2 IMPLANT
SUT NOVA NAB GS-22 2 2-0 T-19 (SUTURE) IMPLANT
SUT SILK 2 0 FSL 18 (SUTURE) ×2 IMPLANT
SUT VIC AB 2-0 CT1 27 (SUTURE) ×1
SUT VIC AB 2-0 CT1 TAPERPNT 27 (SUTURE) ×2 IMPLANT
SUT VIC AB 3-0 SH 27 (SUTURE) ×2
SUT VIC AB 3-0 SH 27X BRD (SUTURE) ×2 IMPLANT

## 2022-07-02 NOTE — Interval H&P Note (Signed)
History and Physical Interval Note:  07/02/2022 7:25 AM  Justin Barrett  has presented today for surgery, with the diagnosis of Right Inguinal hernia.  The various methods of treatment have been discussed with the patient and family. After consideration of risks, benefits and other options for treatment, the patient has consented to  Procedure(s): OPEN HERNIA REPAIR INGUINAL ADULT WITH MESH (Right) as a surgical intervention.  The patient's history has been reviewed, patient examined, no change in status, stable for surgery.  I have reviewed the patient's chart and labs.  Questions were answered to the patient's satisfaction.     [place]

## 2022-07-02 NOTE — Anesthesia Procedure Notes (Signed)
Procedure Name: LMA Insertion Date/Time: 07/02/2022 7:38 AM  Performed by: Camillia Herter, RNPre-anesthesia Checklist: Patient identified, Emergency Drugs available, Suction available and Patient being monitored Patient Re-evaluated:Patient Re-evaluated prior to induction Oxygen Delivery Method: Circle system utilized Preoxygenation: Pre-oxygenation with 100% oxygen Induction Type: IV induction Ventilation: Mask ventilation without difficulty LMA: LMA inserted LMA Size: 4.0 Number of attempts: 1 Placement Confirmation: positive ETCO2 and breath sounds checked- equal and bilateral Tube secured with: Tape Dental Injury: Teeth and Oropharynx as per pre-operative assessment

## 2022-07-02 NOTE — Discharge Instructions (Signed)
Ambulatory Surgery Discharge Instructions  General Anesthesia or Sedation Do not drive or operate heavy machinery for 24 hours.  Do not consume alcohol, tranquilizers, sleeping medications, or any non-prescribed medications for 24 hours. Do not make important decisions or sign any important papers in the next 24 hours. You should have someone with you tonight at home.  Activity  You are advised to go directly home from the hospital.  Restrict your activities and rest for a day.  Resume light activity tomorrow. No heavy lifting over 10 lbs or strenuous exercise.  Fluids and Diet Begin with clear liquids, bouillon, dry toast, soda crackers.  If not nauseated, you may go to a regular diet when you desire.  Greasy and spicy foods are not advised.  Medications  If you have not had a bowel movement in 24 hours, take 2 tablespoons over the counter Milk of mag.             You May resume your blood thinners tomorrow (Aspirin, coumadin, or other).  You are being discharged with prescriptions for Opioid/Narcotic Medications: There are some specific considerations for these medications that you should know. Opioid Meds have risks & benefits. Addiction to these meds is always a concern with prolonged use Take medication only as directed Do not drive while taking narcotic pain medication Do not crush tablets or capsules Do not use a different container than medication was dispensed in Lock the container of medication in a cool, dry place out of reach of children and pets. Opioid medication can cause addiction Do not share with anyone else (this is a felony) Do not store medications for future use. Dispose of them properly.     Disposal:  Find a  household drug take back site near you.  If you can't get to a drug take back site, use the recipe below as a last resort to dispose of expired, unused or unwanted drugs. Disposal  (Do not dispose chemotherapy drugs this way, talk to your  prescribing doctor instead.) Step 1: Mix drugs (do not crush) with dirt, kitty litter, or used coffee grounds and add a small amount of water to dissolve any solid medications. Step 2: Seal drugs in plastic bag. Step 3: Place plastic bag in trash. Step 4: Take prescription container and scratch out personal information, then recycle or throw away.  Operative Site  You have a liquid bandage over your incisions, this will begin to flake off in about a week. Ok to shower tomorrow. Keep wound clean and dry. No baths or swimming. No lifting more than 10 pounds.  Contact Information: If you have questions or concerns, please call our office, [phone], Monday- Thursday 8AM-5PM and Friday 8AM-12Noon.  If it is after hours or on the weekend, please call [facility], [phone], and ask to speak to the surgeon on call for Dr. Abbigale Mcelhaney at [place].   SPECIFIC COMPLICATIONS TO WATCH FOR: Inability to urinate Fever over 101? F by mouth Nausea and vomiting lasting longer than 24 hours. Pain not relieved by medication ordered Swelling around the operative site Increased redness, warmth, hardness, around operative area Numbness, tingling, or cold fingers or toes Blood -soaked dressing, (small amounts of oozing may be normal) Increasing and progressive drainage from surgical area or exam site  

## 2022-07-02 NOTE — Transfer of Care (Signed)
Immediate Anesthesia Transfer of Care Note  Patient: Justin Barrett  Procedure(s) Performed: OPEN HERNIA REPAIR INGUINAL ADULT WITH MESH (Right: Inguinal)  Patient Location: PACU  Anesthesia Type:General  Level of Consciousness: drowsy  Airway & Oxygen Therapy: Patient connected to face mask oxygen  Post-op Assessment: Report given to RN and Post -op Vital signs reviewed and stable  Post vital signs: Reviewed and stable  Last Vitals:  Vitals Value Taken Time  BP 146/88 07/02/22 0903  Temp 36.4 C 07/02/22 0903  Pulse 57 07/02/22 0905  Resp 13 07/02/22 0905  SpO2 100 % 07/02/22 0905  Vitals shown include unvalidated device data.  Last Pain:  Vitals:   07/02/22 0656  TempSrc: Oral  PainSc: 0-No pain      Patients Stated Pain Goal: 6 (27/12/92 9090)  Complications: No notable events documented.

## 2022-07-02 NOTE — Progress Notes (Signed)
[facility]  Spoke with the patient's cousin on the phone.  I explained that he tolerated the procedure well.  He has dissolvable stitches under the skin with overlying skin glue.  This will flake off in 10-14 days.  He will be discharged home with a narcotic pain medication that he should take as needed for pain.  He should also take a stool softener while taking the narcotic pain medication.  He should also take scheduled tylenol for the next 2 weeks.  He will follow up with me in office in 2 weeks.  All questions were answered to his expressed satisfaction.  Graciella Freer, DO [facility] [address] [place], [place] [address] [phone] (office)

## 2022-07-02 NOTE — Anesthesia Preprocedure Evaluation (Signed)
Anesthesia Evaluation  Patient identified by MRN, date of birth, ID band Patient awake    Reviewed: Allergy & Precautions, H&P , NPO status , Patient's Chart, lab work & pertinent test results, reviewed documented beta blocker date and time   Airway Mallampati: II  TM Distance: >3 FB Neck ROM: full    Dental no notable dental hx.    Pulmonary neg pulmonary ROS,    Pulmonary exam normal breath sounds clear to auscultation       Cardiovascular Exercise Tolerance: Good hypertension, negative cardio ROS   Rhythm:regular Rate:Normal     Neuro/Psych negative neurological ROS  negative psych ROS   GI/Hepatic negative GI ROS, Neg liver ROS,   Endo/Other  negative endocrine ROS  Renal/GU ESRF and DialysisRenal disease  negative genitourinary   Musculoskeletal   Abdominal   Peds  Hematology negative hematology ROS (+)   Anesthesia Other Findings   Reproductive/Obstetrics negative OB ROS                             Anesthesia Physical Anesthesia Plan  ASA: 3  Anesthesia Plan: General and General LMA   Post-op Pain Management:    Induction:   PONV Risk Score and Plan: Ondansetron  Airway Management Planned:   Additional Equipment:   Intra-op Plan:   Post-operative Plan:   Informed Consent: I have reviewed the patients History and Physical, chart, labs and discussed the procedure including the risks, benefits and alternatives for the proposed anesthesia with the patient or authorized representative who has indicated his/her understanding and acceptance.     Dental Advisory Given  Plan Discussed with: CRNA  Anesthesia Plan Comments:         Anesthesia Quick Evaluation

## 2022-07-02 NOTE — Op Note (Signed)
[facility] Operative Note  07/02/22  Preoperative Diagnosis: Right inguinal hernia    Postoperative Diagnosis: Same   Procedure(s) Performed: Open right inguinal hernia repair with mesh   Surgeon: Graciella Freer, DO    Assistants: Leeann Must, RNFA   Anesthesia: LMA   Anesthesiologist: Louann Sjogren, MD    Specimens: None   Estimated Blood Loss: Minimal   Blood Replacement: None    Complications: None   Wound Class: Clean   Operative Indications: Patient is a 61 year old male who presents for open right inguinal hernia repair with mesh.  He notes a bulge and needs his hernia repaired prior to him being able to receive a kidney transplant.  He is agreeable to surgery.  All risks and benefits of performing this procedure were discussed with the patient including pain, infection, bleeding, damage to the surrounding structures, and need for more procedures or surgery. The patient voiced understanding of the procedure, all questions were sought and answered, and consent was obtained.  Findings:Indirect right inguinal hernia with inguinal floor weakening   Procedure: The patient was taken to the operating room and placed supine. General endotracheal anesthesia was induced. Intravenous antibiotics were administered per protocol.  A time out was preformed verifying the correct patient, procedure, site, positioning and implants.  The right groin and scrotum were prepared and draped in the usual sterile fashion.   An incision was made in a natural skin crease between the pubic tubercle and the anterior superior iliac spine.  The incision was deepened with electrocautery through Scarpa's and Camper's fascia until the aponeurosis of the external oblique was encountered.  This was cleaned and the external ring was exposed.  An incision was made in the midportion of the external oblique aponeurosis in the direction of its fibers. The ilioinguinal nerve was not  identified.  Flaps of the external oblique were developed cephalad and inferiorly.    The cord was identified and it was gently dissented free at the pubic tubercle and encircled with a Penrose drain.  Attention was then directed at the anteromedial aspect of the cord, where an indirect hernia sac was identified.  The sac was carefully dissected free from the cord down to the level of the internal ring.  The vas and testicular vessels were identified and protected from harm.  Once the sac was dissected free from the cords, the Penrose was placed around the cord which was retracted inferiorly out of the field of view.  The hernia sac was suture ligated and reduced within the abdomen.  Attention was then turned to the floor of the canal, which was grossly weakened without any defined defect or sac.  The Bard Keyhole Mesh was sutured to the inguinal ligament inferiorly starting at the pubic tubercle using 2-0 Novafil interrupted sutures.  The mesh was sutured superiorly to the conjoint tendon using 2-0 Novafil interrupted sutures.  Care was taken to ensure the mesh was placed in a relaxed fashion to avoid excessive tension and no neurovascular structures were caught in the repair.  Laterally the tails of the mesh were crossed and the internal ring was recreated, allowing for passage of cords without tension.   Hemostasis was adequate.  The Penrose was removed.  The external oblique aponeurosis was closed with a 2-0 Vicryl suture in a running fashion, taking care to not catch the ilioinguinal nerve in the suture line.  Scarpa's fashion was closed with 3-0 Vicryl interrupted sutures. The skin was closed with a subcuticular  4-0 Monocryl suture.  Dermabond was applied.   The testis was gently pulled down into its anatomic position in the scrotum.  The patient tolerated the procedure well and was taken to the PACU in stable condition. All counts were correct at the end of the case.       Graciella Freer, DO   [facility] [address] [place], [place] [address] [phone] (office)

## 2022-07-03 NOTE — Anesthesia Postprocedure Evaluation (Signed)
Anesthesia Post Note  Patient: Justin Barrett  Procedure(s) Performed: OPEN HERNIA REPAIR INGUINAL ADULT WITH MESH (Right: Inguinal)  Patient location during evaluation: Phase II Anesthesia Type: General Level of consciousness: awake Pain management: pain level controlled Vital Signs Assessment: post-procedure vital signs reviewed and stable Respiratory status: spontaneous breathing and respiratory function stable Cardiovascular status: blood pressure returned to baseline and stable Postop Assessment: no headache and no apparent nausea or vomiting Anesthetic complications: no Comments: Late entry   No notable events documented.   Last Vitals:  Vitals:   07/02/22 1000 07/02/22 1014  BP: 139/83 (!) 168/83  Pulse: (!) 54 (!) 58  Resp: 13 15  Temp:  36.6 C  SpO2: 99% 100%    Last Pain:  Vitals:   07/02/22 1014  TempSrc: Oral  PainSc: [place]

## 2022-07-07 ENCOUNTER — Encounter (HOSPITAL_COMMUNITY): Payer: Self-pay | Admitting: Surgery

## 2022-07-14 ENCOUNTER — Ambulatory Visit (INDEPENDENT_AMBULATORY_CARE_PROVIDER_SITE_OTHER): Payer: Medicare Other | Admitting: Surgery

## 2022-07-14 VITALS — BP 193/104 | HR 77 | Temp 98.1°F | Resp 12 | Ht 71.0 in | Wt 153.0 lb

## 2022-07-14 DIAGNOSIS — Z09 Encounter for follow-up examination after completed treatment for conditions other than malignant neoplasm: Secondary | ICD-10-CM

## 2022-07-14 NOTE — Progress Notes (Addendum)
[facility] Note   HPI:  61 y.o. Male presents to clinic for post-op follow-up status post right inguinal hernia repair on 10/26.  He has been doing very well since surgery.  He is taking Tylenol and oxycodone to help control his pain.  He denies significant issues with pain.  He is tolerating a diet without nausea and vomiting.  He is moving his bowels without issue.  He is undergoing dialysis without problem.  He has no issues at his incision site.  Denies fevers and chills.  Review of Systems:  All other review of systems: otherwise negative   Vital Signs:  BP (!) 193/104   Pulse 77   Temp 98.1 F (36.7 C) (Oral)   Resp 12   Ht 5\' 11"  (1.803 m)   Wt 153 lb (69.4 kg)   SpO2 96%   BMI 21.34 kg/m    Physical Exam:  Physical Exam Vitals reviewed.  Constitutional:      Appearance: Normal appearance.  Skin:    Comments: Right inguinal incision site healing well with overlying Dermabond, associated edema with a little seroma underneath the incision site; nontender to palpation, no erythema or induration  Neurological:     Mental Status: He is alert.     Laboratory studies: None  Imaging:  None  Assessment:  61 y.o. yo Male who presents for follow-up status post open right inguinal hernia repair with mesh on 10/26  Plan:  -Patient doing very well from a surgical standpoint.  Tolerating a diet, adequate pain control, and moving his bowels. -Advised him that he has a small seroma at his incision site.  He should ice his groin in the evenings.  This area will continue to soften and improve over the next weeks to months -Advised him to use antibiotic ointment at his incision site prior to showering to help dissolve away the remaining glue -Advised him to follow-up as needed  All of the above recommendations were discussed with the patient, and all of patient's questions were answered to his expressed satisfaction.  Graciella Freer, DO [facility] [address] [place], [place] [address] [phone] (office)

## 2024-04-17 IMAGING — DX DG CHEST 1V PORT
1 series · 1 of 1 positions shown · non-contrast
Comparison: 08/29/2017

CLINICAL DATA: Shortness of breath and cough. History of end-stage
renal disease.

EXAM:
PORTABLE CHEST 1 VIEW

[chest ap]
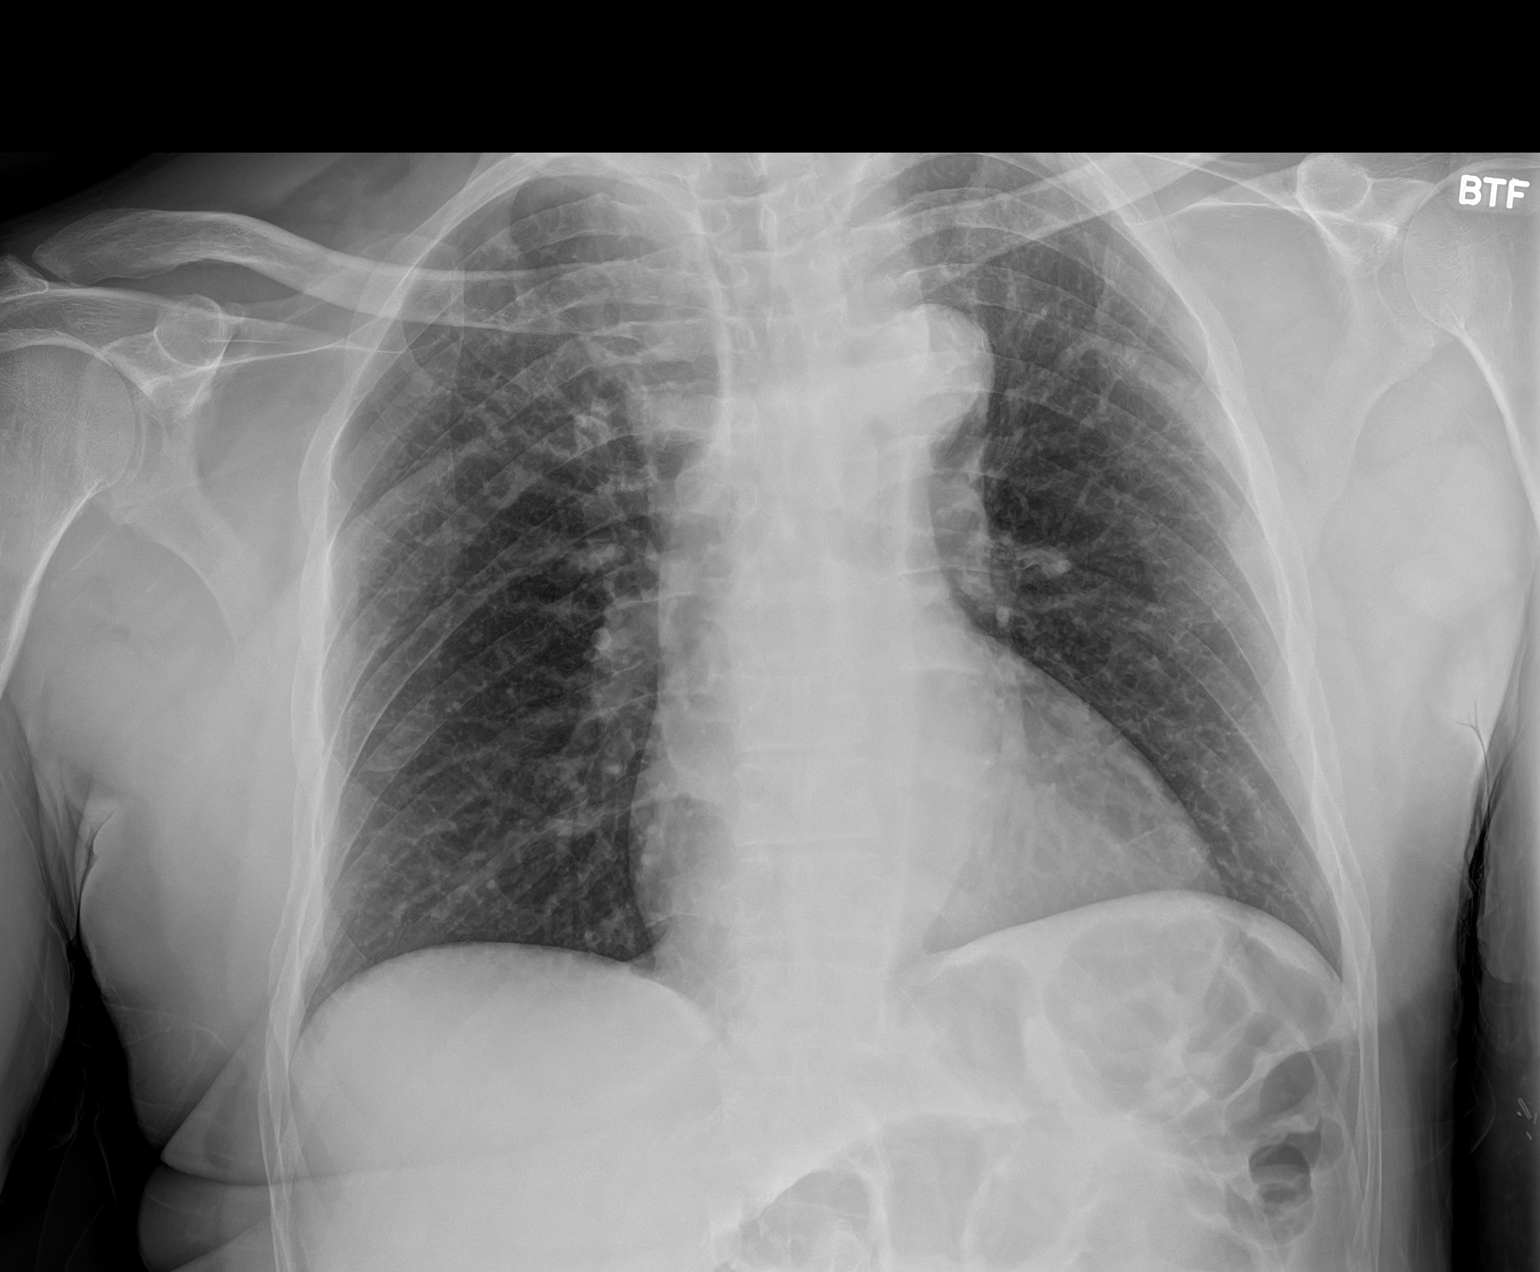

[1 of 1 positions shown; findings below may reference images not displayed]

FINDINGS: Stable top-normal heart size and tortuous thoracic aorta. Previously
noted right-sided tunneled dialysis catheter is no longer present.
Lungs demonstrate chronic scarring without overt edema, airspace
consolidation, pleural fluid or pneumothorax.
IMPRESSION: Stable top-normal heart size.  No acute pulmonary findings.
